# Patient Record
Sex: Male | Born: 1955 | Race: White | Hispanic: No | Marital: Single | Smoking: Current every day smoker
Health system: Southern US, Community
[De-identification: ages and names within clinical notes are randomized; demographics above are authoritative.]

## PROBLEM LIST (undated history)

## (undated) DIAGNOSIS — R51 Headache: Secondary | ICD-10-CM

## (undated) DIAGNOSIS — M1990 Unspecified osteoarthritis, unspecified site: Secondary | ICD-10-CM

## (undated) DIAGNOSIS — E785 Hyperlipidemia, unspecified: Secondary | ICD-10-CM

## (undated) DIAGNOSIS — I10 Essential (primary) hypertension: Secondary | ICD-10-CM

## (undated) DIAGNOSIS — F32A Depression, unspecified: Secondary | ICD-10-CM

## (undated) DIAGNOSIS — F329 Major depressive disorder, single episode, unspecified: Secondary | ICD-10-CM

## (undated) HISTORY — PX: OTHER SURGICAL HISTORY: SHX169

## (undated) HISTORY — DX: Depression, unspecified: F32.A

## (undated) HISTORY — DX: Major depressive disorder, single episode, unspecified: F32.9

## (undated) HISTORY — DX: Headache: R51

## (undated) HISTORY — DX: Hyperlipidemia, unspecified: E78.5

## (undated) HISTORY — DX: Unspecified osteoarthritis, unspecified site: M19.90

---

## 2003-02-20 HISTORY — PX: THUMB FUSION: SUR636

## 2011-02-06 ENCOUNTER — Encounter (HOSPITAL_COMMUNITY): Payer: Self-pay

## 2011-02-06 ENCOUNTER — Encounter (HOSPITAL_COMMUNITY): Payer: Self-pay | Admitting: Pharmacy Technician

## 2011-02-06 ENCOUNTER — Other Ambulatory Visit: Payer: Self-pay

## 2011-02-06 ENCOUNTER — Other Ambulatory Visit: Payer: Self-pay | Admitting: Otolaryngology

## 2011-02-06 ENCOUNTER — Encounter (HOSPITAL_COMMUNITY)
Admission: RE | Admit: 2011-02-06 | Discharge: 2011-02-06 | Disposition: A | Discharge status: Home / Self Care | Payer: Worker's Compensation | Source: Ambulatory Visit | Attending: Specialist | Admitting: Specialist

## 2011-02-06 DIAGNOSIS — Z01812 Encounter for preprocedural laboratory examination: Secondary | ICD-10-CM | POA: Insufficient documentation

## 2011-02-06 DIAGNOSIS — Z01818 Encounter for other preprocedural examination: Secondary | ICD-10-CM | POA: Insufficient documentation

## 2011-02-06 DIAGNOSIS — Z01810 Encounter for preprocedural cardiovascular examination: Secondary | ICD-10-CM | POA: Insufficient documentation

## 2011-02-06 HISTORY — DX: Essential (primary) hypertension: I10

## 2011-02-06 LAB — COMPREHENSIVE METABOLIC PANEL
AST: 27 U/L (ref 0–37)
Albumin: 4.3 g/dL (ref 3.5–5.2)
Calcium: 10.4 mg/dL (ref 8.4–10.5)
Chloride: 100 mEq/L (ref 96–112)
Creatinine, Ser: 0.86 mg/dL (ref 0.50–1.35)

## 2011-02-06 LAB — DIFFERENTIAL
Lymphocytes Relative: 29 % (ref 12–46)
Monocytes Absolute: 0.7 10*3/uL (ref 0.1–1.0)
Monocytes Relative: 11 % (ref 3–12)
Neutro Abs: 3.6 10*3/uL (ref 1.7–7.7)

## 2011-02-06 LAB — CBC
MCV: 88.8 fL (ref 78.0–100.0)
Platelets: 202 10*3/uL (ref 150–400)
RDW: 13.3 % (ref 11.5–15.5)
WBC: 6.4 10*3/uL (ref 4.0–10.5)

## 2011-02-06 LAB — URINALYSIS, ROUTINE W REFLEX MICROSCOPIC
Bilirubin Urine: NEGATIVE
Hgb urine dipstick: NEGATIVE
Specific Gravity, Urine: 1.013 (ref 1.005–1.030)
pH: 7 (ref 5.0–8.0)

## 2011-02-06 LAB — SURGICAL PCR SCREEN: Staphylococcus aureus: INVALID — AB

## 2011-02-06 NOTE — Patient Instructions (Addendum)
[name]  02/06/2011   Your procedure is scheduled on:  02/28/2011 1230-200pm  Report to [facility] at 1030 AM.  Call this number if you have problems the morning of surgery: [phone]   Remember:   Do not eat food:After Midnight.  May have clear liquids:until Midnight .  Clear liquids include soda, tea, black coffee, apple or grape juice, broth.  Take these medicines the morning of surgery with A SIP OF WATER:    Do not wear jewelry,   Do not wear lotions, powders, or perfumes.     Do not bring valuables to the hospital.  Contacts, dentures or bridgework may not be worn into surgery.  Leave suitcase in the car. After surgery it may be brought to your room.  For patients admitted to the hospital, checkout time is 11:00 AM the day of discharge. 02/16/11  5PM MESSAGE HAD BEEN LEFT FOR PT-REGARDING SURGERY TIME CHANGE TO 1:45 PM --AND PT NEEDING TO ARRIVE TO SHORT STAY BY 11:45 AM ON 02/27/10--NOTHING TO EAT AFTER MIDNIGHT THE NIGHT BEFORE SURGERY-MAY HAVE CLEAR LIQUIDS TO DRINK FROM [place] UNTIL 7:30 AM--NOTHING AFTER 7:30 AM.  PT CALLED BACK--RECEIVED MESSAGE.  PAT [name] RN       Special Instructions: CHG Shower Use Special Wash: 1/2 bottle night before surgery and 1/2 bottle morning of surgery. Shower chin to toes with CHG. Wash face and private parts with regular soap.    Please read over the following fact sheets that you were given: MRSA Information, Incentive Spirometry Fact Sheet, coughing and deep breathing exercises, leg exercises

## 2011-02-06 NOTE — Pre-Procedure Instructions (Signed)
02/06/11 rerequested CXR done at [facility]- office to resend.

## 2011-02-08 NOTE — Pre-Procedure Instructions (Signed)
cxr report received from [facility] -it was done 01/19/11 at [facility]--report placed on this chart.

## 2011-02-09 LAB — MRSA CULTURE

## 2011-02-20 HISTORY — PX: SHOULDER SURGERY: SHX246

## 2011-02-28 ENCOUNTER — Encounter (HOSPITAL_COMMUNITY)
Admission: RE | Disposition: A | Discharge status: Home / Self Care | Payer: Self-pay | Source: Ambulatory Visit | Attending: Specialist

## 2011-02-28 ENCOUNTER — Encounter (HOSPITAL_COMMUNITY): Payer: Self-pay | Admitting: *Deleted

## 2011-02-28 ENCOUNTER — Encounter (HOSPITAL_COMMUNITY): Payer: Self-pay | Admitting: Anesthesiology

## 2011-02-28 ENCOUNTER — Ambulatory Visit (HOSPITAL_COMMUNITY): Payer: Worker's Compensation | Admitting: Anesthesiology

## 2011-02-28 ENCOUNTER — Observation Stay (HOSPITAL_COMMUNITY)
Admission: RE | Admit: 2011-02-28 | Discharge: 2011-03-01 | Disposition: A | Discharge status: Home / Self Care | Payer: Worker's Compensation | Source: Ambulatory Visit | Attending: Specialist | Admitting: Specialist

## 2011-02-28 DIAGNOSIS — S43429A Sprain of unspecified rotator cuff capsule, initial encounter: Principal | ICD-10-CM | POA: Insufficient documentation

## 2011-02-28 DIAGNOSIS — M75100 Unspecified rotator cuff tear or rupture of unspecified shoulder, not specified as traumatic: Secondary | ICD-10-CM

## 2011-02-28 DIAGNOSIS — X58XXXA Exposure to other specified factors, initial encounter: Secondary | ICD-10-CM | POA: Insufficient documentation

## 2011-02-28 DIAGNOSIS — M7520 Bicipital tendinitis, unspecified shoulder: Secondary | ICD-10-CM | POA: Insufficient documentation

## 2011-02-28 DIAGNOSIS — I10 Essential (primary) hypertension: Secondary | ICD-10-CM | POA: Insufficient documentation

## 2011-02-28 DIAGNOSIS — F17200 Nicotine dependence, unspecified, uncomplicated: Secondary | ICD-10-CM | POA: Insufficient documentation

## 2011-02-28 DIAGNOSIS — M24119 Other articular cartilage disorders, unspecified shoulder: Secondary | ICD-10-CM | POA: Insufficient documentation

## 2011-02-28 SURGERY — SHOULDER ARTHROSCOPY WITH SUBACROMIAL DECOMPRESSION, ROTATOR CUFF REPAIR AND BICEP TENDON REPAIR
Anesthesia: General | Site: Shoulder | Laterality: Right | Wound class: Clean

## 2011-02-28 MED ORDER — PROMETHAZINE HCL 25 MG/ML IJ SOLN: 6.2500 mg | INTRAMUSCULAR | Status: DC | PRN

## 2011-02-28 MED ORDER — LIDOCAINE HCL (CARDIAC) 20 MG/ML IV SOLN
INTRAVENOUS | Status: DC | PRN
  Administered 2011-02-28: 50 mg via INTRAVENOUS

## 2011-02-28 MED ORDER — SODIUM CHLORIDE 0.9 % IV SOLN
INTRAVENOUS | Status: DC
  Administered 2011-03-01: 01:00:00 via INTRAVENOUS

## 2011-02-28 MED ORDER — HYDROMORPHONE HCL PF 1 MG/ML IJ SOLN
0.5000 mg | INTRAMUSCULAR | Status: DC | PRN
  Administered 2011-03-01: 1 mg via INTRAVENOUS
  Filled 2011-02-28 (×2): qty 1

## 2011-02-28 MED ORDER — LOSARTAN POTASSIUM 50 MG PO TABS
50.0000 mg | ORAL_TABLET | Freq: Every day | ORAL | Status: DC
  Filled 2011-02-28: qty 1

## 2011-02-28 MED ORDER — CISATRACURIUM BESYLATE 2 MG/ML IV SOLN
INTRAVENOUS | Status: DC | PRN
  Administered 2011-02-28 (×3): 2 mg via INTRAVENOUS
  Administered 2011-02-28: 10 mg via INTRAVENOUS

## 2011-02-28 MED ORDER — HYDROMORPHONE HCL PF 1 MG/ML IJ SOLN
INTRAMUSCULAR | Status: AC
  Administered 2011-02-28: 1 mg
  Filled 2011-02-28: qty 1

## 2011-02-28 MED ORDER — BUPIVACAINE-EPINEPHRINE 0.5% -1:200000 IJ SOLN
INTRAMUSCULAR | Status: DC | PRN
  Administered 2011-02-28: 25 mL

## 2011-02-28 MED ORDER — OXYCODONE-ACETAMINOPHEN 5-325 MG PO TABS
1.0000 | ORAL_TABLET | ORAL | Status: DC | PRN
  Administered 2011-02-28 – 2011-03-01 (×3): 2 via ORAL
  Filled 2011-02-28 (×3): qty 2

## 2011-02-28 MED ORDER — EPHEDRINE SULFATE 50 MG/ML IJ SOLN
INTRAMUSCULAR | Status: DC | PRN
  Administered 2011-02-28: 10 mg via INTRAVENOUS

## 2011-02-28 MED ORDER — SODIUM CHLORIDE 0.9 % IR SOLN
Status: DC | PRN
  Administered 2011-02-28: 15:00:00

## 2011-02-28 MED ORDER — AMLODIPINE BESYLATE 5 MG PO TABS
5.0000 mg | ORAL_TABLET | Freq: Every day | ORAL | Status: DC
  Filled 2011-02-28: qty 1

## 2011-02-28 MED ORDER — METOCLOPRAMIDE HCL 10 MG PO TABS: 5.0000 mg | ORAL_TABLET | Freq: Three times a day (TID) | ORAL | Status: DC | PRN

## 2011-02-28 MED ORDER — METHYLPHENIDATE HCL ER (LA) 10 MG PO CP24
20.0000 mg | ORAL_CAPSULE | ORAL | Status: DC
  Administered 2011-03-01: 20 mg via ORAL
  Filled 2011-02-28: qty 2

## 2011-02-28 MED ORDER — ACETAMINOPHEN 10 MG/ML IV SOLN
INTRAVENOUS | Status: DC | PRN
  Administered 2011-02-28: 1000 mg via INTRAVENOUS

## 2011-02-28 MED ORDER — PHENYLEPHRINE HCL 10 MG/ML IJ SOLN
INTRAMUSCULAR | Status: DC | PRN
  Administered 2011-02-28 (×2): 80 ug via INTRAVENOUS

## 2011-02-28 MED ORDER — ONDANSETRON HCL 4 MG PO TABS: 4.0000 mg | ORAL_TABLET | Freq: Four times a day (QID) | ORAL | Status: DC | PRN

## 2011-02-28 MED ORDER — LACTATED RINGERS IV SOLN
INTRAVENOUS | Status: DC | PRN
  Administered 2011-02-28: 14:00:00

## 2011-02-28 MED ORDER — HYDROMORPHONE HCL PF 1 MG/ML IJ SOLN: 0.2500 mg | INTRAMUSCULAR | Status: DC | PRN

## 2011-02-28 MED ORDER — HYDROCHLOROTHIAZIDE 25 MG PO TABS
25.0000 mg | ORAL_TABLET | Freq: Every day | ORAL | Status: DC
  Filled 2011-02-28: qty 1

## 2011-02-28 MED ORDER — METOCLOPRAMIDE HCL 5 MG/ML IJ SOLN: 5.0000 mg | Freq: Three times a day (TID) | INTRAMUSCULAR | Status: DC | PRN

## 2011-02-28 MED ORDER — GLYCOPYRROLATE 0.2 MG/ML IJ SOLN
INTRAMUSCULAR | Status: DC | PRN
  Administered 2011-02-28: .6 mg via INTRAVENOUS

## 2011-02-28 MED ORDER — PHENYLEPHRINE HCL 10 MG/ML IJ SOLN
10.0000 mg | INTRAVENOUS | Status: DC | PRN
  Administered 2011-02-28: 50 ug/min via INTRAVENOUS

## 2011-02-28 MED ORDER — OLMESARTAN MEDOXOMIL 40 MG PO TABS
40.0000 mg | ORAL_TABLET | Freq: Every day | ORAL | Status: DC
  Filled 2011-02-28: qty 1

## 2011-02-28 MED ORDER — MENTHOL 3 MG MT LOZG: 1.0000 | LOZENGE | OROMUCOSAL | Status: DC | PRN

## 2011-02-28 MED ORDER — FENTANYL CITRATE 0.05 MG/ML IJ SOLN
INTRAMUSCULAR | Status: DC | PRN
  Administered 2011-02-28 (×2): 25 ug via INTRAVENOUS
  Administered 2011-02-28 (×2): 100 ug via INTRAVENOUS

## 2011-02-28 MED ORDER — HETASTARCH-ELECTROLYTES 6 % IV SOLN
INTRAVENOUS | Status: DC | PRN
  Administered 2011-02-28: 14:00:00 via INTRAVENOUS

## 2011-02-28 MED ORDER — DIPHENHYDRAMINE HCL 12.5 MG/5ML PO ELIX: 12.5000 mg | ORAL_SOLUTION | ORAL | Status: DC | PRN

## 2011-02-28 MED ORDER — HYDROCODONE-ACETAMINOPHEN 7.5-325 MG PO TABS: 1.0000 | ORAL_TABLET | ORAL | Status: DC | PRN

## 2011-02-28 MED ORDER — LACTATED RINGERS IR SOLN
Status: DC | PRN
  Administered 2011-02-28: 2

## 2011-02-28 MED ORDER — BUSPIRONE HCL 15 MG PO TABS
15.0000 mg | ORAL_TABLET | Freq: Every day | ORAL | Status: DC
  Filled 2011-02-28: qty 1

## 2011-02-28 MED ORDER — OLMESARTAN-AMLODIPINE-HCTZ 40-5-25 MG PO TABS: 1.0000 | ORAL_TABLET | Freq: Every day | ORAL | Status: DC

## 2011-02-28 MED ORDER — ONDANSETRON HCL 4 MG/2ML IJ SOLN
INTRAMUSCULAR | Status: DC | PRN
  Administered 2011-02-28: 4 mg via INTRAVENOUS

## 2011-02-28 MED ORDER — NEOSTIGMINE METHYLSULFATE 1 MG/ML IJ SOLN
INTRAMUSCULAR | Status: DC | PRN
  Administered 2011-02-28: 4 mg via INTRAVENOUS

## 2011-02-28 MED ORDER — ACETAMINOPHEN 325 MG PO TABS
650.0000 mg | ORAL_TABLET | Freq: Four times a day (QID) | ORAL | Status: DC | PRN
  Administered 2011-03-01: 650 mg via ORAL
  Filled 2011-02-28: qty 2

## 2011-02-28 MED ORDER — METHOCARBAMOL 100 MG/ML IJ SOLN
500.0000 mg | Freq: Four times a day (QID) | INTRAVENOUS | Status: DC | PRN
  Filled 2011-02-28 (×2): qty 5

## 2011-02-28 MED ORDER — LACTATED RINGERS IV SOLN
INTRAVENOUS | Status: DC
  Administered 2011-02-28 (×2): via INTRAVENOUS

## 2011-02-28 MED ORDER — MIDAZOLAM HCL 5 MG/5ML IJ SOLN
INTRAMUSCULAR | Status: DC | PRN
  Administered 2011-02-28: 2 mg via INTRAVENOUS

## 2011-02-28 MED ORDER — PHENOL 1.4 % MT LIQD: 1.0000 | OROMUCOSAL | Status: DC | PRN

## 2011-02-28 MED ORDER — CEFAZOLIN SODIUM-DEXTROSE 2-3 GM-% IV SOLR
2.0000 g | INTRAVENOUS | Status: AC
  Administered 2011-02-28: 2 g via INTRAVENOUS

## 2011-02-28 MED ORDER — KETOROLAC TROMETHAMINE 30 MG/ML IJ SOLN
30.0000 mg | Freq: Four times a day (QID) | INTRAMUSCULAR | Status: DC
  Administered 2011-02-28 – 2011-03-01 (×3): 30 mg via INTRAVENOUS
  Filled 2011-02-28 (×6): qty 1

## 2011-02-28 MED ORDER — MEPERIDINE HCL 25 MG/ML IJ SOLN: 6.2500 mg | INTRAMUSCULAR | Status: DC | PRN

## 2011-02-28 MED ORDER — ONDANSETRON HCL 4 MG/2ML IJ SOLN: 4.0000 mg | Freq: Four times a day (QID) | INTRAMUSCULAR | Status: DC | PRN

## 2011-02-28 MED ORDER — ACETAMINOPHEN 650 MG RE SUPP: 650.0000 mg | Freq: Four times a day (QID) | RECTAL | Status: DC | PRN

## 2011-02-28 MED ORDER — CHLORHEXIDINE GLUCONATE 4 % EX LIQD: 60.0000 mL | Freq: Once | CUTANEOUS | Status: DC

## 2011-02-28 MED ORDER — CEFAZOLIN SODIUM-DEXTROSE 2-3 GM-% IV SOLR
2.0000 g | Freq: Four times a day (QID) | INTRAVENOUS | Status: DC
  Administered 2011-03-01 (×2): 2 g via INTRAVENOUS
  Filled 2011-02-28 (×4): qty 50

## 2011-02-28 MED ORDER — HYDROMORPHONE HCL PF 1 MG/ML IJ SOLN
INTRAMUSCULAR | Status: DC | PRN
  Administered 2011-02-28 (×2): 1 mg via INTRAVENOUS

## 2011-02-28 MED ORDER — LACTATED RINGERS IV SOLN: INTRAVENOUS | Status: DC

## 2011-02-28 MED ORDER — METHOCARBAMOL 500 MG PO TABS
500.0000 mg | ORAL_TABLET | Freq: Four times a day (QID) | ORAL | Status: DC | PRN
  Administered 2011-02-28 – 2011-03-01 (×3): 500 mg via ORAL
  Filled 2011-02-28 (×3): qty 1

## 2011-02-28 MED ORDER — PROPOFOL 10 MG/ML IV EMUL
INTRAVENOUS | Status: DC | PRN
  Administered 2011-02-28: 50 mg via INTRAVENOUS
  Administered 2011-02-28: 200 mg via INTRAVENOUS

## 2011-02-28 SURGICAL SUPPLY — 57 items
ANCH SUT 2 CP-2 ROTR CUF (Anchor) ×2 IMPLANT
ANCH SUT PUSHLCK 24X4.5 STRL (Orthopedic Implant) ×1 IMPLANT
ANCHOR ROTATOR CUFF #2 (Anchor) ×4 IMPLANT
APL SKNCLS STERI-STRIP NONHPOA (GAUZE/BANDAGES/DRESSINGS)
BAG SPEC THK2 15X12 ZIP CLS (MISCELLANEOUS) ×1
BAG ZIPLOCK 12X15 (MISCELLANEOUS) ×2 IMPLANT
BENZOIN TINCTURE PRP APPL 2/3 (GAUZE/BANDAGES/DRESSINGS) IMPLANT
BLADE CUDA 4.2 (BLADE) ×1 IMPLANT
BLADE OSCILLATING/SAGITTAL (BLADE)
BLADE SW THK.38XMED LNG THN (BLADE) ×1 IMPLANT
BNDG ADH 5X4 AIR PERM ELC (GAUZE/BANDAGES/DRESSINGS)
BNDG COHESIVE 4X5 WHT NS (GAUZE/BANDAGES/DRESSINGS) IMPLANT
BUR OVAL CARBIDE 4.0 (BURR) ×1 IMPLANT
CANNULA ACUFO 5X76 (CANNULA) ×1 IMPLANT
CHLORAPREP W/TINT 26ML (MISCELLANEOUS) IMPLANT
CLEANER TIP ELECTROSURG 2X2 (MISCELLANEOUS) ×2 IMPLANT
CLOTH BEACON ORANGE TIMEOUT ST (SAFETY) ×2 IMPLANT
DECANTER SPIKE VIAL GLASS SM (MISCELLANEOUS) ×2 IMPLANT
DRAPE ORTHO SPLIT 77X108 STRL (DRAPES) ×2
DRAPE POUCH INSTRU U-SHP 10X18 (DRAPES) ×2 IMPLANT
DRAPE SURG ORHT 6 SPLT 77X108 (DRAPES) ×1 IMPLANT
DRSG ADAPTIC 3X8 NADH LF (GAUZE/BANDAGES/DRESSINGS) ×2 IMPLANT
DRSG PAD ABDOMINAL 8X10 ST (GAUZE/BANDAGES/DRESSINGS) ×1 IMPLANT
DURAPREP 26ML APPLICATOR (WOUND CARE) ×2 IMPLANT
ELECT NEEDLE TIP 2.8 STRL (NEEDLE) ×2 IMPLANT
ELECT REM PT RETURN 9FT ADLT (ELECTROSURGICAL) ×2
ELECTRODE REM PT RTRN 9FT ADLT (ELECTROSURGICAL) ×1 IMPLANT
GAUZE SPONGE 4X4 12PLY STRL LF (GAUZE/BANDAGES/DRESSINGS) ×1 IMPLANT
GLOVE BIOGEL PI IND STRL 6.5 (GLOVE) ×1 IMPLANT
GLOVE BIOGEL PI IND STRL 8 (GLOVE) ×1 IMPLANT
GLOVE BIOGEL PI INDICATOR 6.5 (GLOVE) ×1
GLOVE BIOGEL PI INDICATOR 8 (GLOVE)
GLOVE ECLIPSE 6.5 STRL STRAW (GLOVE) ×2 IMPLANT
GLOVE SURG SS PI 8.0 STRL IVOR (GLOVE) ×4 IMPLANT
KIT BASIN OR (CUSTOM PROCEDURE TRAY) ×2 IMPLANT
MANIFOLD NEPTUNE II (INSTRUMENTS) ×2 IMPLANT
NDL MA TROC 1/2 (NEEDLE) ×1 IMPLANT
NDL SPNL 18GX3.5 QUINCKE PK (NEEDLE) IMPLANT
NEEDLE MA TROC 1/2 (NEEDLE) IMPLANT
NEEDLE MA TROC 1/2 CIR (NEEDLE) IMPLANT
NEEDLE SPNL 18GX3.5 QUINCKE PK (NEEDLE) ×2 IMPLANT
PACK SHOULDER CUSTOM OPM052 (CUSTOM PROCEDURE TRAY) ×2 IMPLANT
POSITIONER SURGICAL ARM (MISCELLANEOUS) ×2 IMPLANT
PUSHLOCK PEEK 4.5X24 (Orthopedic Implant) ×2 IMPLANT
SET ARTHROSCOPY TUBING (MISCELLANEOUS) ×2
SET ARTHROSCOPY TUBING LN (MISCELLANEOUS) IMPLANT
SLING ULTRA II L (ORTHOPEDIC SUPPLIES) ×2 IMPLANT
SPONGE LAP 4X18 X RAY DECT (DISPOSABLE) IMPLANT
STRIP CLOSURE SKIN 1/2X4 (GAUZE/BANDAGES/DRESSINGS) ×1 IMPLANT
SUT BONE WAX W31G (SUTURE) ×1 IMPLANT
SUT ETHIBOND 0 (SUTURE) ×2 IMPLANT
SUT ETHIBOND 2 OS 4 DA (SUTURE) ×2 IMPLANT
SUT ETHILON 4 0 PS 2 18 (SUTURE) ×1 IMPLANT
SUT PROLENE 3 0 PS 2 (SUTURE) ×1 IMPLANT
SUT VIC AB 1-0 CT2 27 (SUTURE) ×3 IMPLANT
SUT VIC AB 2-0 CT2 27 (SUTURE) ×1 IMPLANT
SUT VICRYL 0 UR6 27IN ABS (SUTURE) ×2 IMPLANT

## 2011-02-28 NOTE — Anesthesia Postprocedure Evaluation (Signed)
  Anesthesia Post-op Note  Patient: John Best  Procedure(s) Performed:  SHOULDER ARTHROSCOPY WITH SUBACROMIAL DECOMPRESSION, ROTATOR CUFF REPAIR AND BICEP TENDON REPAIR - Right Shoulder Arthroscopy, Subacromial Decompression, Mini Open Rotator Cuff Repair, biceps tenotomy  Patient Location: PACU  Anesthesia Type: General  Level of Consciousness: awake and alert   Airway and Oxygen Therapy: Patient Spontanous Breathing  Post-op Pain: mild  Post-op Assessment: Post-op Vital signs reviewed, Patient's Cardiovascular Status Stable, Respiratory Function Stable, Patent Airway and No signs of Nausea or vomiting  Post-op Vital Signs: stable  Complications: No apparent anesthesia complications

## 2011-02-28 NOTE — Anesthesia Preprocedure Evaluation (Signed)
Anesthesia Evaluation  Patient identified by MRN, date of birth, ID band Patient awake    Reviewed: Allergy & Precautions, H&P , NPO status , Patient's Chart, lab work & pertinent test results  Airway Mallampati: II TM Distance: >3 FB Neck ROM: full    Dental No notable dental hx.    Pulmonary neg pulmonary ROS,  clear to auscultation  Pulmonary exam normal       Cardiovascular Exercise Tolerance: Good hypertension, Pt. on medications neg cardio ROS regular Normal    Neuro/Psych Negative Neurological ROS  Negative Psych ROS   GI/Hepatic negative GI ROS, Neg liver ROS,   Endo/Other  Negative Endocrine ROS  Renal/GU negative Renal ROS  Genitourinary negative   Musculoskeletal   Abdominal   Peds  Hematology negative hematology ROS (+)   Anesthesia Other Findings   Reproductive/Obstetrics negative OB ROS                           Anesthesia Physical Anesthesia Plan  ASA: II  Anesthesia Plan: General and General ETT   Post-op Pain Management: MAC Combined w/ Regional for Post-op pain   Induction:   Airway Management Planned:   Additional Equipment:   Intra-op Plan:   Post-operative Plan:   Informed Consent: I have reviewed the patients History and Physical, chart, labs and discussed the procedure including the risks, benefits and alternatives for the proposed anesthesia with the patient or authorized representative who has indicated his/her understanding and acceptance.   Dental Advisory Given  Plan Discussed with: CRNA  Anesthesia Plan Comments:         Anesthesia Quick Evaluation

## 2011-02-28 NOTE — H&P (Signed)
John Best is an 56 y.o. male.   Chief Complaint: right RCT HPI: Right rotator cuff tear   Past Medical History  Diagnosis Date  . Hypertension     Past Surgical History  Procedure Date  . Other surgical history     bilateral thumb fusions     History reviewed. No pertinent family history. Social History:  reports that he has been smoking Cigarettes.  He has a 30 pack-year smoking history. He has never used smokeless tobacco. He reports that he drinks alcohol. He reports that he does not use illicit drugs.  Allergies: No Known Allergies  Medications Prior to Admission  Medication Dose Route Frequency Provider Last Rate Last Dose  . ceFAZolin (ANCEF) IVPB 2 g/50 mL premix  2 g Intravenous 60 min Pre-Op Liam Graham, PA      . chlorhexidine (HIBICLENS) 4 % liquid 4 application  60 mL Topical Once Liam Graham, PA      . lactated ringers infusion   Intravenous Continuous Liam Graham, PA       Medications Prior to Admission  Medication Sig Dispense Refill  . Multiple Vitamin (MULTIVITAMIN) tablet Take 1 tablet by mouth daily.        No results found for this or any previous visit (from the past 48 hour(s)). No results found.  Review of Systems  Constitutional: Negative.   HENT: Negative.   Eyes: Negative.   Respiratory: Negative.   Cardiovascular: Negative.   Gastrointestinal: Negative.   Genitourinary: Negative.   Musculoskeletal: Positive for joint pain.  Skin: Negative.   Neurological: Negative.   Endo/Heme/Allergies: Negative.   Psychiatric/Behavioral: Negative.     Blood pressure 139/94, pulse 89, temperature 98.4 F (36.9 C), temperature source Oral, resp. rate 18, SpO2 96.00%. Physical Exam  Constitutional: He appears well-developed.  HENT:  Head: Normocephalic.  Eyes: Pupils are equal, round, and reactive to light.  Neck: Normal range of motion.  Cardiovascular: Normal rate.   Respiratory: Effort normal.  GI: Soft.    Musculoskeletal: He exhibits tenderness.       Weak ext rotation right shoulder. Positive impingement right     Assessment/Plan  Right rotator cuff tear. Plan RCR possible SA SAD debr labrum. Risks discussed.  [name] C 02/28/2011, 12:54 PM

## 2011-02-28 NOTE — Transfer of Care (Signed)
Immediate Anesthesia Transfer of Care Note  Patient: John Best  Procedure(s) Performed:  SHOULDER ARTHROSCOPY WITH SUBACROMIAL DECOMPRESSION, ROTATOR CUFF REPAIR AND BICEP TENDON REPAIR - Right Shoulder Arthroscopy, Subacromial Decompression, Mini Open Rotator Cuff Repair, biceps tenotomy  Patient Location: PACU  Anesthesia Type: General  Level of Consciousness: awake, alert , oriented, patient cooperative and responds to stimulation  Airway & Oxygen Therapy: Patient Spontanous Breathing and Patient connected to face mask oxygen  Post-op Assessment: Report given to PACU RN, Post -op Vital signs reviewed and stable and Patient moving all extremities  Post vital signs: Reviewed and stable  Complications: No apparent anesthesia complications

## 2011-02-28 NOTE — Brief Op Note (Signed)
02/28/2011  3:37 PM  PATIENT:  John Best  56 y.o. male  PRE-OPERATIVE DIAGNOSIS:  rotator cuff tear on right, impingement, biceps tendonitits  POST-OPERATIVE DIAGNOSIS:  rotator cuff tear on right, impingement, biceps tendonitis  PROCEDURE:  Procedure(s): SHOULDER ARTHROSCOPY WITH SUBACROMIAL DECOMPRESSION, ROTATOR CUFF REPAIR AND BICEP TENDON REPAIR  SURGEON:  Surgeon(s): Javier Docker  PHYSICIAN ASSISTANT:   ASSISTANTS: Strader   ANESTHESIA:   general  EBL:  Total I/O In: 1500 [I.V.:1000; IV Piggyback:500] Out: -   BLOOD ADMINISTERED:none  DRAINS: none   LOCAL MEDICATIONS USED:  MARCAINE 20CC  SPECIMEN:  No Specimen  DISPOSITION OF SPECIMEN:  N/A  COUNTS:  YES  TOURNIQUET:  * No tourniquets in log *  DICTATION:  Dictation number [id number]  PLAN OF CARE: Admit for overnight observation  PATIENT DISPOSITION:  PACU - hemodynamically stable.   Delay start of Pharmacological VTE agent (>24hrs) due to surgical blood loss or risk of bleeding:  {YES/NO/NOT APPLICABLE:20182

## 2011-03-01 MED ORDER — METHOCARBAMOL 500 MG PO TABS: 500.0000 mg | ORAL_TABLET | Freq: Four times a day (QID) | ORAL | Status: AC | PRN

## 2011-03-01 MED ORDER — HYDROCODONE-ACETAMINOPHEN 7.5-325 MG PO TABS: 1.0000 | ORAL_TABLET | ORAL | Status: AC | PRN

## 2011-03-01 NOTE — Progress Notes (Signed)
Pt has been d/c to the home. Pt and pt's family verbalize understanding of all d/c instructions and state there are no further questions. Pt taken to car via wheelchair.Cyndie Woodbeck Lorraine Lawarence Meek, RN    

## 2011-03-01 NOTE — Progress Notes (Signed)
Subjective: 1 Day Post-Op Procedure(s) (LRB): SHOULDER ARTHROSCOPY WITH SUBACROMIAL DECOMPRESSION, ROTATOR CUFF REPAIR AND BICEP TENDON REPAIR (Right) Patient reports pain as 5 on 0-10 scale.   Denies CP or SOB.  Voiding without difficulty. Positive flatus. Objective: Vital signs in last 24 hours: Temp:  [97.3 F (36.3 C)-99.4 F (37.4 C)] 98.6 F (37 C) (01/10 0515) Pulse Rate:  [66-89] 73  (01/10 0515) Resp:  [11-18] 18  (01/10 0515) BP: (106-139)/(62-94) 107/70 mmHg (01/10 0932) SpO2:  [92 %-100 %] 97 % (01/10 0515)  Intake/Output from previous day: 01/09 0701 - 01/10 0700 In: 4862 [P.O.:1462; I.V.:2800; IV Piggyback:600] Out: 825 [Urine:775; Blood:50] Intake/Output this shift: Total I/O In: -  Out: 450 [Urine:450]  No results found for this basename: HGB:5 in the last 72 hours No results found for this basename: WBC:2,RBC:2,HCT:2,PLT:2 in the last 72 hours No results found for this basename: NA:2,K:2,CL:2,CO2:2,BUN:2,CREATININE:2,GLUCOSE:2,CALCIUM:2 in the last 72 hours No results found for this basename: LABPT:2,INR:2 in the last 72 hours  Neurologically intact Neurovascular intact Sensation intact distally Compartment soft sling intact  Assessment/Plan: 1 Day Post-Op Procedure(s) (LRB): SHOULDER ARTHROSCOPY WITH SUBACROMIAL DECOMPRESSION, ROTATOR CUFF REPAIR AND BICEP TENDON REPAIR (Right) D/c home after OT  Eylin Pontarelli R. 03/01/2011, 9:51 AM

## 2011-03-01 NOTE — Progress Notes (Signed)
Pt has been educated regarding correct use of Incentive Spirometer (IS). Pt verbalizes understanding of correct use. Pt states that there is no further questions regarding correct use of the IS. Ritamarie Arkin Lorraine Oluwaseyi Tull, RN    

## 2011-03-01 NOTE — Op Note (Signed)
NAMEGAUDENCIO, CHESNUT NO.:  [id number]  MEDICAL RECORD NO.:  [id number]  LOCATION:  1528                         FACILITY:  [facility]  PHYSICIAN:  Jene Every, M.D.    DATE OF BIRTH:  [birth year]  DATE OF PROCEDURE: DATE OF DISCHARGE:                              OPERATIVE REPORT   PREOPERATIVE DIAGNOSES:  Rotator cuff tear of the right shoulder, biceps tendinopathy, and labral tear.  POSTOPERATIVE DIAGNOSES:  Rotator cuff tear of the right shoulder, biceps tendinopathy, and labral tear.  PROCEDURE PERFORMED: 1. Right shoulder arthroscopy with tenotomy of the biceps tendon and     debridement of extensive tearing of the superior, posterior, and     anterior labrum. 2. Mini open rotator cuff repair utilizing Mitek push lock suture     anchors.  ANESTHESIA:  General.  ASSISTANT:  Roma Schanz, PA.  BRIEF HISTORY:  This is a pleasant gentleman, 76, injured himself at work, has a full-thickness tear of the posterior supraspinatus and infraspinatus, persistent pain and anterior pain as well, biceps tendinopathy with subluxing the biceps tendon as noted on the MRI, and he had labral tearing.  He is indicated for shoulder arthroscopy with debridement of labrum, possible biceps tenotomy versus tenodesis, followed by mini open rotator cuff repair and subacromial decompression. Risk and benefits discussed including bleeding, infection, damage to neurovascular structures, no change in symptoms, worsening symptoms, need for repeat debridement, DVT, PE, anesthetic complications, etc.  TECHNIQUE:  With the patient in supine beach chair position, after induction of adequate general anesthesia, 2 g Kefzol, the right shoulder and upper extremity was prepped and draped in usual sterile fashion. Surgical marker utilized to delineate the acromion, AC joint, and coracoid.  Standard posterolateral and anterolateral portals were utilized with an incision through the skin  only.  With the arm in 70:30 position, we advanced a cannula in the glenohumeral joint penetrating atraumatically.  Noted was full-thickness tear of the rotator cuff at the posterior portion of the supraspinatus.  There was extensive tearing of the labrum.  The biceps was extending into the bicipital groove, was torn along the significant portion, attenuated and frayed.  There was approximately 20% remaining, that was unrepairable as well as the biceps extended into extensive labral tearing.  There was detachment of the labrum and extensive tearing of the labrum.  __________ localizes with an anterior portal with an 18-gauge needle beneath the biceps tendon with a #11 blade and introduced the cannula and introduced a shaver, debrided the labrum to a stable base.  Multiple areas of attachment were noted of the labrum.  A basket was introduced and performed a tenotomy of the biceps tendon, and then the shaver was introduced and debrided the stump to the stable base.  The inferior labrum was affected. Minimal degenerative changes of the glenohumeral joint was noted. Minimal tearing of the subscap was noted as well.  Again, I felt leaving the biceps tendon with a pain generator using traction on the torn labrum.  On location of the rotator cuff, tear was more on the posterior aspect of the supraspinatus and the infraspinatus, and I did not feel a separate approach for a tenodesis of the  biceps was appropriate.  I redirected the camera in subacromial space, made a small incision in the lateral skin for an anterolateral portal, triangulated, introduced the cannula, used at 65 mmHg.  I released the CA ligament, performed a full bursectomy and the tear was confirmed.  There was some fair amount of fraying over the tear and the side-to-side tight tear was split after the subacromial decompression with a mini open incision.  Small 2 cm incision was made between the anterolateral heads of the deltoid  off the anterolateral aspect of the acromion.  After 0.25% Marcaine with epinephrine was infiltrated there, I then preserved the deltoid attachment, placed a self-retaining retractor, digitally lysing the adhesions and the subacromial joint, identified the rotator cuff tear. Again, I was at the junction of the infraspinatus, posterior aspect of the supraspinatus longitudinal, significant fraying and hyperemia noted there.  I debrided the edges to good bleeding tissue.  I prepared a bed with good bleeding bone with a Matt Holmes rongeur and mobilize the tissue side- to-side, placed 2 Mitek suture anchors there along the length of the tear and threaded on either side of the supraspinatus and infraspinatus, mobilized and side-to-side repair, good surgical knot and then a freehand Ethibond interrupted figure-of-eight suture side-to-side.  The leaflets were then crossed and placed over the lateral aspect of the greater tuberosity and I inserted in the push lock for double row protection using awl, inserted push lock, tapped it in that as well as the Mitek suture anchors, had good resistance to pullout.  Full coverage was obtained.  Again, although there was an extensive fraying there, had an appropriate opportunity to heal.  Watertight coverage was noted.  We used a 3 mm Kerrison around the small spur on the anterolateral aspect of the acromion as well.  Copiously irrigated the subacromial space. The remainder was unremarkable.  I repaired the raphe with #1 Vicryl interrupted figure-of-eight sutures, subcu with 2-0 Vicryl simple sutures.  Skin was reapproximated 4-0 subcuticular Prolene.  Wound re- enforced with Steri-Strips.  Sterile dressing applied.  Placed supine on the hospital bed, extubated without difficulty after placing an abduction pillow and the sling.  The patient tolerated the procedure well.  No complications.  Assistant, Roma Schanz.  Minimal blood loss.     Jene Every,  M.D.     Cordelia Pen  D:  02/28/2011  T:  03/01/2011  Job:  [id number]

## 2011-03-01 NOTE — Progress Notes (Signed)
OT Eval   Written eval completed and will be filed in the shadow chart.  Ral[place], OTR/L [phone] 03/01/2011

## 2011-03-01 NOTE — Progress Notes (Signed)
Occupational Therapy Treatment Patient Details Name: John Best MRN: [id number] DOB: May 02, 1955 Today's Date: 03/01/2011 1105 [address]2 [place]  OT Assessment/Plan OT Assessment/Plan OT Plan: Discharge plan remains appropriate Follow Up Recommendations:  (pt will follow up with Dr Shelle Iron for OP when appropriate)  No further acute needs OT Goals Acute Rehab OT Goals OT Goal Formulation: With patient Time For Goal Achievement: 7 days Miscellaneous OT Goals Miscellaneous OT Goal #1: daughter will be independent with donning sling OT Goal: Miscellaneous Goal #1 - Progress: Met Miscellaneous OT Goal #2: daughter will verbalize understanding of precautions for pt's RUE secondary to RCR, without cues OT Goal: Miscellaneous Goal #2 - Progress: Met  OT Treatment Precautions/Restrictions  Precautions Precautions: Shoulder Type of Shoulder Precautions: sling off for bathing/dressing; no ROM Precaution Booklet Issued: Yes (comment) Restrictions Weight Bearing Restrictions: No   ADL ADL ADL Comments: daugher presented:  educated on ADL precautions and pt observed UB dressing, and dressing change.  She was able to don sling with set up.  She verbalizes understanding of all education Mobility  Bed Mobility Bed Mobility: Yes Supine to Sit: 6: Modified independent (Device/Increase time);HOB elevated (Comment degrees) (60) Transfers Transfers: Yes Sit to Stand: 7: Independent Exercises    End of Session OT - End of Session Activity Tolerance: Patient tolerated treatment well Patient left: in bed;with call bell in reach;Other (comment) (eob) Nurse Communication:  (Licensed conveyancer for page when daughter is here) General Behavior During Session: [facility] for tasks performed Cognition: [facility] for tasks performed  Annett Boxwell [phone] 03/01/2011, 11:31 AM

## 2011-03-02 NOTE — Discharge Summary (Signed)
Physician Discharge Summary  Patient ID: John Best MRN: [id number] DOB/AGE: 1955/10/06 56 y.o.  Admit date: 02/28/2011 Discharge date: 03/02/2011  Admission Diagnoses: Hypertension  Rotator Cuff Tear  Discharge Diagnoses:  Hypertension  S/p RCR  Discharged Condition: good  Hospital Course:  uneventfull  Procedure Note: Right shoulder arthroscopy with tenotomy of the biceps tendon and  debridement of extensive tearing of the superior, posterior, and  anterior labrum.  2. Mini open rotator cuff repair utilizing Mitek push lock suture  anchors.    Disposition: Home or Self Care  Discharge Orders    Future Orders Please Complete By Expires   Diet - low sodium heart healthy      Call MD / Call 911      Comments:   If you experience chest pain or shortness of breath, CALL 911 and be transported to the hospital emergency room.  If you develope a fever above 101 F, pus (white drainage) or increased drainage or redness at the wound, or calf pain, call your surgeon's office.   Constipation Prevention      Comments:   Drink plenty of fluids.  Prune juice may be helpful.  You may use a stool softener, such as Colace (over the counter) 100 mg twice a day.  Use MiraLax (over the counter) for constipation as needed.   Increase activity slowly as tolerated      Weight Bearing as taught in Physical Therapy      Comments:   Use a walker or crutches as instructed.   Discharge instructions      Comments:    Ice pack to shoulder 3-4 times a day May get out of sling in AM and start gentle pendulum exercises.  You are encouraged to move the elbow,hand, and wrist.   Elevate shoulder and elbow on pillow several times a day. Use sling at all times unless arm is supported or exercising Avoid overhead activity No lifting No driving for 4-6 weeks Change dressing daily       Discharge Medication List as of 03/01/2011 11:31 AM    START taking these medications   Details    HYDROcodone-acetaminophen (NORCO) 7.5-325 MG per tablet Take 1-2 tablets by mouth every 4 (four) hours as needed., Starting 03/01/2011, Until Sun 03/11/11, Print    methocarbamol (ROBAXIN) 500 MG tablet Take 1 tablet (500 mg total) by mouth every 6 (six) hours as needed., Starting 03/01/2011, Until Sun 03/11/11, Print      CONTINUE these medications which have NOT CHANGED   Details  Multiple Vitamin (MULTIVITAMIN) tablet Take 1 tablet by mouth daily., Until Discontinued, Historical Med    busPIRone (BUSPAR) 15 MG tablet Take 15 mg by mouth daily with breakfast.  , Until Discontinued, Historical Med    losartan (COZAAR) 50 MG tablet Take 50 mg by mouth every morning. , Until Discontinued, Historical Med    methylphenidate (RITALIN LA) 20 MG 24 hr capsule Take 20 mg by mouth every morning. , Until Discontinued, Historical Med    naproxen (NAPROSYN) 500 MG tablet Take 500 mg by mouth every morning. , Until Discontinued, Historical Med    Olmesartan-Amlodipine-HCTZ (TRIBENZOR) 40-5-25 MG TABS Take 1 tablet by mouth daily with breakfast.  , Until Discontinued, Historical Med      STOP taking these medications     HYDROcodone-acetaminophen (NORCO) 5-325 MG per tablet        Follow-up Information    Follow up with BEANE,JEFFREY C in 14 days.   Contact information:  [facility] [address], [address] [place] [address] [phone]          Signed: Liam Graham. 03/02/2011, 8:28 AM

## 2011-03-23 MED FILL — Ropivacaine HCl Inj 5 MG/ML: INTRAMUSCULAR | Qty: 30 | Status: AC

## 2011-05-16 ENCOUNTER — Encounter: Payer: Self-pay | Admitting: Family Medicine

## 2011-05-16 ENCOUNTER — Ambulatory Visit: Payer: Self-pay | Admitting: Family Medicine

## 2011-05-16 ENCOUNTER — Ambulatory Visit (INDEPENDENT_AMBULATORY_CARE_PROVIDER_SITE_OTHER): Payer: Worker's Compensation | Admitting: Family Medicine

## 2011-05-16 VITALS — BP 120/78 | HR 100 | Temp 98.2°F | Resp 12 | Ht 67.0 in | Wt 211.0 lb

## 2011-05-16 DIAGNOSIS — F988 Other specified behavioral and emotional disorders with onset usually occurring in childhood and adolescence: Secondary | ICD-10-CM | POA: Insufficient documentation

## 2011-05-16 DIAGNOSIS — I10 Essential (primary) hypertension: Secondary | ICD-10-CM

## 2011-05-16 DIAGNOSIS — G43909 Migraine, unspecified, not intractable, without status migrainosus: Secondary | ICD-10-CM | POA: Insufficient documentation

## 2011-05-16 DIAGNOSIS — E785 Hyperlipidemia, unspecified: Secondary | ICD-10-CM

## 2011-05-16 MED ORDER — METHYLPHENIDATE HCL ER (LA) 20 MG PO CP24: 20.0000 mg | ORAL_CAPSULE | ORAL | Status: DC

## 2011-05-16 MED ORDER — OLMESARTAN-AMLODIPINE-HCTZ 40-5-25 MG PO TABS: 1.0000 | ORAL_TABLET | Freq: Every day | ORAL | Status: DC

## 2011-05-16 NOTE — Progress Notes (Signed)
  Subjective:    Patient ID: John Best, male    DOB: 08/31/1955, 56 y.o.   MRN: [id number]  HPI  Patient seen to establish care.  History reviewed. History of osteoarthritis, migraine headaches, hypertension, hyperlipidemia, Peyronie's disease, and attention deficit disorder. He's taken Ritalin for several years and this seems to work well no adverse side effects. Had multiple orthopedic surgeries including right rotator cuff repair 2013 in January and left and right thumb surgeries related to osteoarthritis.  Patient is divorced. Smokes 1 packs a day. Usually one to 2 alcoholic beverages per day. Works as a [employer] manager.  Family history for father with coronary disease in his 8s. Probable hypertension. Both parents hyperlipidemia. Brother with type 2 diabetes.  Review of Systems  Constitutional: Negative for fever, activity change, appetite change and fatigue.  HENT: Negative for ear pain, congestion and trouble swallowing.   Eyes: Negative for pain and visual disturbance.  Respiratory: Negative for cough, shortness of breath and wheezing.   Cardiovascular: Negative for chest pain and palpitations.  Gastrointestinal: Negative for nausea, vomiting, abdominal pain, diarrhea, constipation, blood in stool, abdominal distention and rectal pain.  Genitourinary: Negative for dysuria, hematuria and testicular pain.  Musculoskeletal: Negative for joint swelling and arthralgias.  Skin: Negative for rash.  Neurological: Negative for dizziness, syncope and headaches.  Hematological: Negative for adenopathy.  Psychiatric/Behavioral: Negative for confusion and dysphoric mood.       Objective:   Physical Exam  Constitutional: He is oriented to person, place, and time. He appears well-developed and well-nourished.  HENT:  Mouth/Throat: Oropharynx is clear and moist.  Neck: Neck supple. No thyromegaly present.  Cardiovascular: Normal rate and regular rhythm.   Pulmonary/Chest: Effort normal and  breath sounds normal. No respiratory distress. He has no wheezes. He has no rales.  Musculoskeletal: He exhibits no edema.  Lymphadenopathy:    He has no cervical adenopathy.  Neurological: He is alert and oriented to person, place, and time. No cranial nerve deficit.          Assessment & Plan:  #1 hypertension. Stable. Refill medication for one year #2 history of attention deficit disorder. Refill Ritalin for 3 months #3 history of hyperlipidemia #4 history of migraine headaches

## 2011-05-16 NOTE — Patient Instructions (Signed)
Consider complete physical by October of this year

## 2011-05-22 ENCOUNTER — Ambulatory Visit: Payer: Self-pay | Admitting: Family Medicine

## 2011-09-17 ENCOUNTER — Telehealth: Payer: Self-pay | Admitting: Family Medicine

## 2011-09-17 NOTE — Telephone Encounter (Signed)
Patient called stating that he need a refill of his ritalin and buspar. Please assist.

## 2011-09-17 NOTE — Telephone Encounter (Signed)
Ritalin last filled on 05-16-11 #30 with 3 refills  Pt has CPX scheduled 10/03/11

## 2011-09-17 NOTE — Telephone Encounter (Signed)
Refill ritalin for 3 months and Buspar for 6 months.

## 2011-09-18 MED ORDER — METHYLPHENIDATE HCL ER (LA) 20 MG PO CP24: 20.0000 mg | ORAL_CAPSULE | ORAL | Status: DC

## 2011-09-18 MED ORDER — BUSPIRONE HCL 15 MG PO TABS: 15.0000 mg | ORAL_TABLET | ORAL | Status: DC | PRN

## 2011-09-18 NOTE — Telephone Encounter (Signed)
Pt informed on VM Rx ready to pick-up 

## 2011-09-26 ENCOUNTER — Other Ambulatory Visit (INDEPENDENT_AMBULATORY_CARE_PROVIDER_SITE_OTHER): Payer: BC Managed Care – PPO

## 2011-09-26 DIAGNOSIS — Z0000 Encounter for general adult medical examination without abnormal findings: Secondary | ICD-10-CM

## 2011-09-26 LAB — LIPID PANEL
Cholesterol: 214 mg/dL — ABNORMAL HIGH (ref 0–200)
HDL: 45.4 mg/dL (ref >39.00)
Triglycerides: 293 mg/dL — ABNORMAL HIGH (ref 0.0–149.0)

## 2011-09-26 LAB — BASIC METABOLIC PANEL
CO2: 29 mEq/L (ref 19–32)
Calcium: 9.8 mg/dL (ref 8.4–10.5)
Chloride: 101 mEq/L (ref 96–112)
Glucose, Bld: 106 mg/dL — ABNORMAL HIGH (ref 70–99)
Sodium: 139 mEq/L (ref 135–145)

## 2011-09-26 LAB — HEPATIC FUNCTION PANEL
Albumin: 4.2 g/dL (ref 3.5–5.2)
Total Protein: 6.8 g/dL (ref 6.0–8.3)

## 2011-09-26 LAB — POCT URINALYSIS DIPSTICK
Bilirubin, UA: NEGATIVE
Ketones, UA: NEGATIVE
Leukocytes, UA: NEGATIVE
Spec Grav, UA: 1.025
pH, UA: 5.5

## 2011-09-26 LAB — CBC WITH DIFFERENTIAL/PLATELET
Basophils Absolute: 0 10*3/uL (ref 0.0–0.1)
Basophils Relative: 0.5 % (ref 0.0–3.0)
Eosinophils Absolute: 0.2 10*3/uL (ref 0.0–0.7)
Hemoglobin: 17 g/dL (ref 13.0–17.0)
Lymphocytes Relative: 28.2 % (ref 12.0–46.0)
Lymphs Abs: 2.1 10*3/uL (ref 0.7–4.0)
MCHC: 33.2 g/dL (ref 30.0–36.0)
MCV: 91.8 fl (ref 78.0–100.0)
Monocytes Absolute: 0.9 10*3/uL (ref 0.1–1.0)
Neutro Abs: 4.1 10*3/uL (ref 1.4–7.7)
RBC: 5.59 Mil/uL (ref 4.22–5.81)
RDW: 14.2 % (ref 11.5–14.6)

## 2011-09-26 LAB — TSH: TSH: 2.82 u[IU]/mL (ref 0.35–5.50)

## 2011-10-03 ENCOUNTER — Encounter: Payer: Self-pay | Admitting: Family Medicine

## 2011-10-03 ENCOUNTER — Ambulatory Visit (INDEPENDENT_AMBULATORY_CARE_PROVIDER_SITE_OTHER): Payer: BC Managed Care – PPO | Admitting: Family Medicine

## 2011-10-03 VITALS — BP 130/70 | HR 80 | Temp 99.0°F | Resp 12 | Ht 66.75 in | Wt 208.0 lb

## 2011-10-03 DIAGNOSIS — D239 Other benign neoplasm of skin, unspecified: Secondary | ICD-10-CM

## 2011-10-03 DIAGNOSIS — Z0000 Encounter for general adult medical examination without abnormal findings: Secondary | ICD-10-CM

## 2011-10-03 DIAGNOSIS — Z299 Encounter for prophylactic measures, unspecified: Secondary | ICD-10-CM

## 2011-10-03 DIAGNOSIS — I951 Orthostatic hypotension: Secondary | ICD-10-CM

## 2011-10-03 DIAGNOSIS — R319 Hematuria, unspecified: Secondary | ICD-10-CM

## 2011-10-03 DIAGNOSIS — D229 Melanocytic nevi, unspecified: Secondary | ICD-10-CM

## 2011-10-03 MED ORDER — ZOSTER VACCINE LIVE 19400 UNT/0.65ML ~~LOC~~ SOLR: 0.6500 mL | Freq: Once | SUBCUTANEOUS | Status: DC

## 2011-10-03 MED ORDER — BUPROPION HCL ER (SMOKING DET) 150 MG PO TB12: 150.0000 mg | ORAL_TABLET | Freq: Two times a day (BID) | ORAL | Status: AC

## 2011-10-03 MED ORDER — AMLODIPINE-OLMESARTAN 5-40 MG PO TABS: 1.0000 | ORAL_TABLET | Freq: Every day | ORAL | Status: DC

## 2011-10-03 NOTE — Progress Notes (Signed)
Subjective:    Patient ID: John Best, male    DOB: August 31, 1955, 56 y.o.   MRN: [id number]  HPI  Patient for complete physical examination. History of hypertension, hyperlipidemia, migraine headaches, and attention deficit disorder. He takes combination blood pressure medication with 3 different drugs in 1 (Tribenzor). He's recently had several episodes of what sounds like orthostasis, especially when working outdoors with increased perspiration. No actual syncope.  Ongoing nicotine use. Smokes one pack per day. Requesting Zyban which his insurance will cover. He has taken Wellbutrin in the past without difficulty. Also requesting shingles vaccine. His insurance covers. Last tetanus about 2 years ago. Colonoscopy at age 67 reportedly normal.  Past Medical History  Diagnosis Date  . Hypertension   . Arthritis   . Depression   . Headache   . Hyperlipidemia    Past Surgical History  Procedure Date  . Other surgical history     bilateral thumb fusions   . Thumb fusion 2005  . Shoulder surgery 2013    reports that he has been smoking Cigarettes.  He has a 30 pack-year smoking history. He has never used smokeless tobacco. He reports that he drinks alcohol. He reports that he does not use illicit drugs. family history includes Diabetes in his brother; Heart disease (age of onset:80) in his father; Hyperlipidemia in his father and mother; and Hypertension in his father. No Known Allergies    Review of Systems  Constitutional: Negative for fever, activity change, appetite change, fatigue and unexpected weight change.  HENT: Negative for ear pain, congestion and trouble swallowing.   Eyes: Negative for pain and visual disturbance.  Respiratory: Negative for cough, shortness of breath and wheezing.   Cardiovascular: Negative for chest pain and palpitations.  Gastrointestinal: Negative for nausea, vomiting, abdominal pain, diarrhea, constipation, blood in stool, abdominal distention and  rectal pain.  Genitourinary: Negative for dysuria, hematuria and testicular pain.  Musculoskeletal: Negative for joint swelling and arthralgias.  Skin: Negative for rash.  Neurological: Positive for dizziness. Negative for syncope and headaches.  Hematological: Negative for adenopathy.  Psychiatric/Behavioral: Negative for confusion and dysphoric mood.       Objective:   Physical Exam  Constitutional: He is oriented to person, place, and time. He appears well-developed and well-nourished. No distress.  HENT:  Head: Normocephalic and atraumatic.  Right Ear: External ear normal.  Left Ear: External ear normal.  Mouth/Throat: Oropharynx is clear and moist.  Eyes: Conjunctivae and EOM are normal. Pupils are equal, round, and reactive to light.  Neck: Normal range of motion. Neck supple. No thyromegaly present.  Cardiovascular: Normal rate, regular rhythm and normal heart sounds.   No murmur heard. Pulmonary/Chest: No respiratory distress. He has no wheezes. He has no rales.  Abdominal: Soft. Bowel sounds are normal. He exhibits no distension and no mass. There is no tenderness. There is no rebound and no guarding.  Musculoskeletal: He exhibits no edema.  Lymphadenopathy:    He has no cervical adenopathy.  Neurological: He is alert and oriented to person, place, and time. He displays normal reflexes. No cranial nerve deficit.  Skin: No rash noted.       Patient has nevus left upper back which is only about 5 mm diameter but has some slight minimal asymmetry. Fairly distinct border. Some color variegation.  Psychiatric: He has a normal mood and affect.          Assessment & Plan:  #1 complete physical. Shingles vaccine given. Tetanus reportedly up-to-date. Smoking  cessation discussed. Trial of Zyban. Labs reviewed with patient. Prediabetes. Work on weight loss. Reduce alcohol consumption. #2 trace hematuria on urine dipstick. Repeat urinalysis at follow up. #3 probable orthostasis  by history. Change blood pressure medication to Azor. Reassess one month #4 atypical nevus upper back. Return in one month and shave excision at that time

## 2011-10-03 NOTE — Patient Instructions (Signed)
Smoking Cessation This document explains the best ways for you to quit smoking and new treatments to help. It lists new medicines that can double or triple your chances of quitting and quitting for good. It also considers ways to avoid relapses and concerns you may have about quitting, including weight gain. NICOTINE: A POWERFUL ADDICTION If you have tried to quit smoking, you know how hard it can be. It is hard because nicotine is a very addictive drug. For some people, it can be as addictive as heroin or cocaine. Usually, people make 2 or 3 tries, or more, before finally being able to quit. Each time you try to quit, you can learn about what helps and what hurts. Quitting takes hard work and a lot of effort, but you can quit smoking. QUITTING SMOKING IS ONE OF THE MOST IMPORTANT THINGS YOU WILL EVER DO.  You will live longer, feel better, and live better.   The impact on your body of quitting smoking is felt almost immediately:   Within 20 minutes, blood pressure decreases. Pulse returns to its normal level.   After 8 hours, carbon monoxide levels in the blood return to normal. Oxygen level increases.   After 24 hours, chance of heart attack starts to decrease. Breath, hair, and body stop smelling like smoke.   After 48 hours, damaged nerve endings begin to recover. Sense of taste and smell improve.   After 72 hours, the body is virtually free of nicotine. Bronchial tubes relax and breathing becomes easier.   After 2 to 12 weeks, lungs can hold more air. Exercise becomes easier and circulation improves.   Quitting will reduce your risk of having a heart attack, stroke, cancer, or lung disease:   After 1 year, the risk of coronary heart disease is cut in half.   After 5 years, the risk of stroke falls to the same as a nonsmoker.   After 10 years, the risk of lung cancer is cut in half and the risk of other cancers decreases significantly.   After 15 years, the risk of coronary heart  disease drops, usually to the level of a nonsmoker.   If you are pregnant, quitting smoking will improve your chances of having a healthy baby.   The people you live with, especially your children, will be healthier.   You will have extra money to spend on things other than cigarettes.  FIVE KEYS TO QUITTING Studies have shown that these 5 steps will help you quit smoking and quit for good. You have the best chances of quitting if you use them together: 1. Get ready.  2. Get support and encouragement.  3. Learn new skills and behaviors.  4. Get medicine to reduce your nicotine addiction and use it correctly.  5. Be prepared for relapse or difficult situations. Be determined to continue trying to quit, even if you do not succeed at first.  1. GET READY  Set a quit date.   Change your environment.   Get rid of ALL cigarettes, ashtrays, matches, and lighters in your home, car, and place of work.   Do not let people smoke in your home.   Review your past attempts to quit. Think about what worked and what did not.   Once you quit, do not smoke. NOT EVEN A PUFF!  2. GET SUPPORT AND ENCOURAGEMENT Studies have shown that you have a better chance of being successful if you have help. You can get support in many ways.  Tell   your family, friends, and coworkers that you are going to quit and need their support. Ask them not to smoke around you.   Talk to your caregivers (doctor, dentist, nurse, pharmacist, psychologist, and/or smoking counselor).   Get individual, group, or telephone counseling and support. The more counseling you have, the better your chances are of quitting. Programs are available at local hospitals and health centers. Call your local health department for information about programs in your area.   Spiritual beliefs and practices may help some smokers quit.   Quit meters are small computer programs online or downloadable that keep track of quit statistics, such as amount  of "quit-time," cigarettes not smoked, and money saved.   Many smokers find one or more of the many self-help books available useful in helping them quit and stay off tobacco.  3. LEARN NEW SKILLS AND BEHAVIORS  Try to distract yourself from urges to smoke. Talk to someone, go for a walk, or occupy your time with a task.   When you first try to quit, change your routine. Take a different route to work. Drink tea instead of coffee. Eat breakfast in a different place.   Do something to reduce your stress. Take a hot bath, exercise, or read a book.   Plan something enjoyable to do every day. Reward yourself for not smoking.   Explore interactive web-based programs that specialize in helping you quit.  4. GET MEDICINE AND USE IT CORRECTLY Medicines can help you stop smoking and decrease the urge to smoke. Combining medicine with the above behavioral methods and support can quadruple your chances of successfully quitting smoking. The [place] Food and Drug Administration (FDA) has approved 7 medicines to help you quit smoking. These medicines fall into 3 categories.  Nicotine replacement therapy (delivers nicotine to your body without the negative effects and risks of smoking):   Nicotine gum: Available over-the-counter.   Nicotine lozenges: Available over-the-counter.   Nicotine inhaler: Available by prescription.   Nicotine nasal spray: Available by prescription.   Nicotine skin patches (transdermal): Available by prescription and over-the-counter.   Antidepressant medicine (helps people abstain from smoking, but how this works is unknown):   Bupropion sustained-release (SR) tablets: Available by prescription.   Nicotinic receptor partial agonist (simulates the effect of nicotine in your brain):   Varenicline tartrate tablets: Available by prescription.   Ask your caregiver for advice about which medicines to use and how to use them. Carefully read the information on the package.    Everyone who is trying to quit may benefit from using a medicine. If you are pregnant or trying to become pregnant, nursing an infant, you are under age 18, or you smoke fewer than 10 cigarettes per day, talk to your caregiver before taking any nicotine replacement medicines.   You should stop using a nicotine replacement product and call your caregiver if you experience nausea, dizziness, weakness, vomiting, fast or irregular heartbeat, mouth problems with the lozenge or gum, or redness or swelling of the skin around the patch that does not go away.   Do not use any other product containing nicotine while using a nicotine replacement product.   Talk to your caregiver before using these products if you have diabetes, heart disease, asthma, stomach ulcers, you had a recent heart attack, you have high blood pressure that is not controlled with medicine, a history of irregular heartbeat, or you have been prescribed medicine to help you quit smoking.  5. BE PREPARED FOR RELAPSE OR   DIFFICULT SITUATIONS  Most relapses occur within the first 3 months after quitting. Do not be discouraged if you start smoking again. Remember, most people try several times before they finally quit.   You may have symptoms of withdrawal because your body is used to nicotine. You may crave cigarettes, be irritable, feel very hungry, cough often, get headaches, or have difficulty concentrating.   The withdrawal symptoms are only temporary. They are strongest when you first quit, but they will go away within 10 to 14 days.  Here are some difficult situations to watch for:  Alcohol. Avoid drinking alcohol. Drinking lowers your chances of successfully quitting.   Caffeine. Try to reduce the amount of caffeine you consume. It also lowers your chances of successfully quitting.   Other smokers. Being around smoking can make you want to smoke. Avoid smokers.   Weight gain. Many smokers will gain weight when they quit, usually  less than 10 pounds. Eat a healthy diet and stay active. Do not let weight gain distract you from your main goal, quitting smoking. Some medicines that help you quit smoking may also help delay weight gain. You can always lose the weight gained after you quit.   Bad mood or depression. There are a lot of ways to improve your mood other than smoking.  If you are having problems with any of these situations, talk to your caregiver. SPECIAL SITUATIONS AND CONDITIONS Studies suggest that everyone can quit smoking. Your situation or condition can give you a special reason to quit.  Pregnant women/new mothers: By quitting, you protect your baby's health and your own.   Hospitalized patients: By quitting, you reduce health problems and help healing.   Heart attack patients: By quitting, you reduce your risk of a second heart attack.   Lung, head, and neck cancer patients: By quitting, you reduce your chance of a second cancer.   Parents of children and adolescents: By quitting, you protect your children from illnesses caused by secondhand smoke.  QUESTIONS TO THINK ABOUT Think about the following questions before you try to stop smoking. You may want to talk about your answers with your caregiver.  Why do you want to quit?   If you tried to quit in the past, what helped and what did not?   What will be the most difficult situations for you after you quit? How will you plan to handle them?   Who can help you through the tough times? Your family? Friends? Caregiver?   What pleasures do you get from smoking? What ways can you still get pleasure if you quit?  Here are some questions to ask your caregiver:  How can you help me to be successful at quitting?   What medicine do you think would be best for me and how should I take it?   What should I do if I need more help?   What is smoking withdrawal like? How can I get information on withdrawal?  Quitting takes hard work and a lot of effort,  but you can quit smoking. FOR MORE INFORMATION  Smokefree.gov (http://www.smokefree.gov) provides free, accurate, evidence-based information and professional assistance to help support the immediate and long-term needs of people trying to quit smoking. Document Released: 01/30/2001 Document Revised: 01/25/2011 Document Reviewed: 11/22/2008 [employer] Patient Information 2012 [employer]. 

## 2011-11-02 ENCOUNTER — Ambulatory Visit: Payer: Self-pay | Admitting: Family Medicine

## 2011-11-05 ENCOUNTER — Encounter: Payer: Self-pay | Admitting: Family Medicine

## 2011-11-05 ENCOUNTER — Ambulatory Visit (INDEPENDENT_AMBULATORY_CARE_PROVIDER_SITE_OTHER): Payer: BC Managed Care – PPO | Admitting: Family Medicine

## 2011-11-05 VITALS — BP 118/70 | Temp 98.1°F | Wt 213.0 lb

## 2011-11-05 DIAGNOSIS — I10 Essential (primary) hypertension: Secondary | ICD-10-CM

## 2011-11-05 DIAGNOSIS — D229 Melanocytic nevi, unspecified: Secondary | ICD-10-CM

## 2011-11-05 DIAGNOSIS — Z23 Encounter for immunization: Secondary | ICD-10-CM

## 2011-11-05 DIAGNOSIS — D239 Other benign neoplasm of skin, unspecified: Secondary | ICD-10-CM

## 2011-11-05 DIAGNOSIS — R319 Hematuria, unspecified: Secondary | ICD-10-CM

## 2011-11-05 LAB — POCT URINALYSIS DIPSTICK
Clarity, UA: NEGATIVE
Glucose, UA: NEGATIVE
Ketones, UA: 1.015
Leukocytes, UA: NEGATIVE
Protein, UA: NEGATIVE

## 2011-11-05 LAB — URINALYSIS, ROUTINE W REFLEX MICROSCOPIC
Bilirubin Urine: NEGATIVE
Leukocytes, UA: NEGATIVE
Nitrite: NEGATIVE
pH: 5.5 (ref 5.0–8.0)

## 2011-11-05 NOTE — Patient Instructions (Addendum)
Keep wound dry for the first 24 hours then clean daily with soap and water for one week. Apply topical antibiotic daily for 3-4 days. Keep covered with clean dressing for 4-5 days. Follow up promptly for any signs of infection such as redness, warmth, pain, or drainage.  

## 2011-11-05 NOTE — Progress Notes (Signed)
  Subjective:    Patient ID: John Best, male    DOB: Jun 24, 1955, 56 y.o.   MRN: [id number]  HPI  Patient for followup. Several items as below  Hypertension. Recently was on combination 3 medications in one pill and was having some orthostasis and fatigue. We dropped his diuretic and he has felt much better. Blood pressures have been well controlled around 120/70. No orthostasis.  Recently had trace blood on urine dipstick. No gross hematuria. Patient trying to quit smoking and currently taking Zyban. No dysuria  Atypical nevus noted on the upper mid back. Here for shave excision. His parents both had some type of skin cancer but no history of melanoma.  Past Medical History  Diagnosis Date  . Hypertension   . Arthritis   . Depression   . Headache   . Hyperlipidemia    Past Surgical History  Procedure Date  . Other surgical history     bilateral thumb fusions   . Thumb fusion 2005  . Shoulder surgery 2013    reports that he has been smoking Cigarettes.  He has a 30 pack-year smoking history. He has never used smokeless tobacco. He reports that he drinks alcohol. He reports that he does not use illicit drugs. family history includes Diabetes in his brother; Heart disease (age of onset:80) in his father; Hyperlipidemia in his father and mother; and Hypertension in his father. No Known Allergies    Review of Systems  Constitutional: Negative for fatigue.  Eyes: Negative for visual disturbance.  Respiratory: Negative for cough, chest tightness and shortness of breath.   Cardiovascular: Negative for chest pain, palpitations and leg swelling.  Neurological: Negative for dizziness, syncope, weakness, light-headedness and headaches.       Objective:   Physical Exam  Constitutional: He appears well-developed and well-nourished.  Cardiovascular: Normal rate and regular rhythm.   Pulmonary/Chest: Effort normal and breath sounds normal. No respiratory distress. He has no  wheezes. He has no rales.  Skin:       Mid upper thoracic region reveals nevus which is about 5-6 mm diameter. Some color variegation. Minimal asymmetry. Slightly jagged border.          Assessment & Plan:  #1 hypertension. Stable. Continue current blood pressure medication #2 hematuria on urine dipstick. Repeat urine today. If still has hematuria consider urine micro #3 atypical nevus. Recommend shave excision for further evaluation. Discussed risk and benefits with patient.  Discussed risks and benefits of shave excision including risks of bleeding, bruising, scar formation, and infection and patient consented to proceed.  Skin prepped with betadine and alcohol and shave excision of lesion (following anesthesia with 1% xylocaine with epinephrine) with #15 blade with minimal bleeding.  Patient tolerated well.  Antibiotic and dressing  applied.

## 2011-11-06 NOTE — Progress Notes (Signed)
Quick Note:  Called and spoke with pt and pt is aware. ______ 

## 2011-11-09 NOTE — Progress Notes (Signed)
Quick Note:  Pt informed on personally identified cell VM ______ 

## 2011-12-17 ENCOUNTER — Ambulatory Visit (INDEPENDENT_AMBULATORY_CARE_PROVIDER_SITE_OTHER): Payer: BC Managed Care – PPO | Admitting: Family Medicine

## 2011-12-17 ENCOUNTER — Encounter: Payer: Self-pay | Admitting: Family Medicine

## 2011-12-17 VITALS — BP 130/78 | Temp 98.0°F | Wt 212.0 lb

## 2011-12-17 DIAGNOSIS — F988 Other specified behavioral and emotional disorders with onset usually occurring in childhood and adolescence: Secondary | ICD-10-CM

## 2011-12-17 DIAGNOSIS — I10 Essential (primary) hypertension: Secondary | ICD-10-CM

## 2011-12-17 DIAGNOSIS — D239 Other benign neoplasm of skin, unspecified: Secondary | ICD-10-CM

## 2011-12-17 MED ORDER — METHYLPHENIDATE HCL ER (LA) 20 MG PO CP24: 20.0000 mg | ORAL_CAPSULE | ORAL | Status: DC

## 2011-12-17 NOTE — Patient Instructions (Signed)
Keep wound dry for the first 24 hours then clean daily with soap and water for one week. Apply topical antibiotic daily for 3-4 days. Keep covered with clean dressing for 4-5 days. Follow up promptly for any signs of infection such as redness, warmth, pain, or drainage.  

## 2011-12-17 NOTE — Progress Notes (Signed)
  Subjective:    Patient ID: John Best, male    DOB: Jul 30, 1955, 56 y.o.   MRN: [id number]  HPI  Patient here for the following issues:  Requesting refills of Ritalin. He takes 20 mg long-acting Ritalin. This seems to be working well. No side effects. Blood pressures been better well controlled current medication. No headaches. No dizziness. No chest pains.  Recent dysplastic nevus mid upper back. Severe atypia. Given severe atypia we recommend a followup for elliptical excision and is here for that today. Good healing from previous biopsy site.  Past Medical History  Diagnosis Date  . Hypertension   . Arthritis   . Depression   . Headache   . Hyperlipidemia    Past Surgical History  Procedure Date  . Other surgical history     bilateral thumb fusions   . Thumb fusion 2005  . Shoulder surgery 2013    reports that he has been smoking Cigarettes.  He has a 30 pack-year smoking history. He has never used smokeless tobacco. He reports that he drinks alcohol. He reports that he does not use illicit drugs. family history includes Diabetes in his brother; Heart disease (age of onset:80) in his father; Hyperlipidemia in his father and mother; and Hypertension in his father. No Known Allergies    Review of Systems  Constitutional: Negative for fatigue.  Eyes: Negative for visual disturbance.  Respiratory: Negative for cough, chest tightness and shortness of breath.   Cardiovascular: Negative for chest pain, palpitations and leg swelling.  Neurological: Negative for dizziness, syncope, weakness, light-headedness and headaches.       Objective:   Physical Exam  Constitutional: He appears well-developed and well-nourished. No distress.  Cardiovascular: Normal rate and regular rhythm.   No murmur heard. Skin:       Patient has small scar mid upper thoracic back region which is 6-7 mm diameter          Assessment & Plan:  #1 severely dysplastic nevus recently removed by  shave excision. Here today for elliptical excision. Discussed risk and benefits of excision and patient consents.  Skin prepped with Betadine. Anesthesia with 1% Xylocaine with epinephrine. We made approximately 2 cm horizontal elliptical excision around scar site. Minimal bleeding. Wound closed with 4-0 Ethilon without difficulty. Antibiotic applied. Specimen sent to pathology for further evaluation. Wound care instruction given #2 attention deficit disorder. Refill Ritalin for 3 months  #3 hypertension stable and improved. Continue current medication

## 2011-12-20 NOTE — Progress Notes (Signed)
Quick Note:  Pt informed on personally identified VM ______ 

## 2011-12-28 ENCOUNTER — Ambulatory Visit (INDEPENDENT_AMBULATORY_CARE_PROVIDER_SITE_OTHER): Payer: BC Managed Care – PPO | Admitting: Family Medicine

## 2011-12-28 ENCOUNTER — Encounter: Payer: Self-pay | Admitting: Family Medicine

## 2011-12-28 VITALS — Temp 98.1°F

## 2011-12-28 DIAGNOSIS — D235 Other benign neoplasm of skin of trunk: Secondary | ICD-10-CM

## 2011-12-28 NOTE — Progress Notes (Signed)
  Subjective:    Patient ID: John Best, male    DOB: [birth year], 56 y.o.   MRN: [id number]  HPI  Patient had recent elliptical biopsy. 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 No residual abnormal cells from excisional bx.. He has healed well with no difficulties.   Review of Systems  Constitutional: Negative for fever and chills.       Objective:   Physical Exam  Constitutional: He appears well-developed and well-nourished.  Skin:       Well healed wound upper back. Sutures removed without difficulty. No erythema. No drainage.          Assessment & Plan:  Recent elliptical excision for severely dysplastic nevus. No problems with healing. No secondary infection. Sutures removed. Followup as needed

## 2012-04-08 ENCOUNTER — Telehealth: Payer: Self-pay | Admitting: Family Medicine

## 2012-04-08 MED ORDER — METHYLPHENIDATE HCL ER (LA) 20 MG PO CP24: 20.0000 mg | ORAL_CAPSULE | ORAL | Status: DC

## 2012-04-08 NOTE — Telephone Encounter (Signed)
Pt needs new rxs ritalin 20 mg. Pt is requesting separate rxs for next 3 months

## 2012-04-08 NOTE — Telephone Encounter (Signed)
Pt informed Rx ready to pick-up on VM 

## 2012-04-08 NOTE — Telephone Encounter (Signed)
Refill OK

## 2012-04-08 NOTE — Telephone Encounter (Signed)
Ritalin daily last filled 12-17-11, #30 with 2 refills Last OV 12-28-11

## 2012-07-22 ENCOUNTER — Telehealth: Payer: Self-pay | Admitting: Family Medicine

## 2012-07-22 NOTE — Telephone Encounter (Signed)
PT called to request a 3 month refill of his methylphenidate (RITALIN LA) 20 MG 24 hr capsule. Please assist.

## 2012-07-23 NOTE — Telephone Encounter (Signed)
Okay to refill Ritalin.

## 2012-07-23 NOTE — Telephone Encounter (Signed)
OK to refill

## 2012-07-24 MED ORDER — METHYLPHENIDATE HCL ER (LA) 20 MG PO CP24: 20.0000 mg | ORAL_CAPSULE | ORAL | Status: DC

## 2012-07-24 NOTE — Telephone Encounter (Signed)
Left message on voicemail to call office. Rx ready for pick up.

## 2012-07-24 NOTE — Telephone Encounter (Signed)
Patient notified to pick up at the front desk.  He stated he will pick it up this evening.

## 2012-10-08 ENCOUNTER — Other Ambulatory Visit: Payer: Self-pay | Admitting: Family Medicine

## 2012-11-10 ENCOUNTER — Telehealth: Payer: Self-pay | Admitting: Family Medicine

## 2012-11-10 MED ORDER — METHYLPHENIDATE HCL ER (LA) 20 MG PO CP24: 20.0000 mg | ORAL_CAPSULE | ORAL | Status: DC

## 2012-11-10 NOTE — Telephone Encounter (Signed)
Pt informed

## 2012-11-10 NOTE — Telephone Encounter (Signed)
Last visit 12/28/2011 Last refill 07/24/12 #30 2 refills

## 2012-11-10 NOTE — Telephone Encounter (Signed)
Pt is calling to request a 3 month refill of his methylphenidate (RITALIN LA) 20 MG 24 hr capsule. Please assist.

## 2012-11-10 NOTE — Telephone Encounter (Signed)
Refill OK.  Will need office follow up within 2 months,

## 2012-11-17 ENCOUNTER — Encounter: Payer: Self-pay | Admitting: Family Medicine

## 2012-11-17 ENCOUNTER — Ambulatory Visit (INDEPENDENT_AMBULATORY_CARE_PROVIDER_SITE_OTHER): Payer: BC Managed Care – PPO | Admitting: Family Medicine

## 2012-11-17 VITALS — BP 124/68 | HR 83 | Temp 98.1°F | Wt 221.0 lb

## 2012-11-17 DIAGNOSIS — I10 Essential (primary) hypertension: Secondary | ICD-10-CM

## 2012-11-17 DIAGNOSIS — Z23 Encounter for immunization: Secondary | ICD-10-CM

## 2012-11-17 MED ORDER — AMLODIPINE-OLMESARTAN 5-40 MG PO TABS: 1.0000 | ORAL_TABLET | Freq: Every day | ORAL | Status: DC

## 2012-11-17 MED ORDER — BUPROPION HCL ER (SR) 150 MG PO TB12: 150.0000 mg | ORAL_TABLET | Freq: Two times a day (BID) | ORAL | Status: DC

## 2012-11-17 NOTE — Progress Notes (Signed)
  Subjective:    Patient ID: John Best, male    DOB: [birth year], 57 y.o.   MRN: [id number]  HPI Patient here for followup regarding hypertension. He currently takes combination drug with amlodipine and Benicar Blood pressures been well controlled. Compliant with therapy. No dizziness. No headaches. No chest pains. Patient still smokes half-pack cigarettes per day or less and is trying to quit. Requesting refills of Wellbutrin which is helped in the past. He remains on Ritalin LA for ADD in this is working well. Requesting flu vaccine.  Past Medical History  Diagnosis Date  . Hypertension   . Arthritis   . Depression   . Headache(784.0)   . Hyperlipidemia    Past Surgical History  Procedure Laterality Date  . Other surgical history      bilateral thumb fusions   . Thumb fusion  2005  . Shoulder surgery  2013    reports that he has been smoking Cigarettes.  He has a 30 pack-year smoking history. He has never used smokeless tobacco. He reports that  drinks alcohol. He reports that he does not use illicit drugs. family history includes Diabetes in his brother; Heart disease (age of onset: 1) in his father; Hyperlipidemia in his father and mother; Hypertension in his father. No Known Allergies    Review of Systems  Constitutional: Negative for appetite change, fatigue and unexpected weight change.  Eyes: Negative for visual disturbance.  Respiratory: Negative for cough, chest tightness and shortness of breath.   Cardiovascular: Negative for chest pain, palpitations and leg swelling.  Endocrine: Negative for polydipsia and polyuria.  Neurological: Negative for dizziness, syncope, weakness, light-headedness and headaches.       Objective:   Physical Exam  Constitutional: He appears well-developed and well-nourished.  Cardiovascular: Normal rate and regular rhythm.   Pulmonary/Chest: Effort normal and breath sounds normal. No respiratory distress. He has no wheezes. He has  no rales.  Musculoskeletal: He exhibits no edema.          Assessment & Plan:  Hypertension. Well controlled. Refill blood pressure medication for one year. We also refilled Wellbutrin which he has used previously for smoking cessation. Flu vaccine given. He has scheduled complete physical in October

## 2012-12-01 ENCOUNTER — Other Ambulatory Visit: Payer: Self-pay | Admitting: Family Medicine

## 2012-12-01 ENCOUNTER — Other Ambulatory Visit (INDEPENDENT_AMBULATORY_CARE_PROVIDER_SITE_OTHER): Payer: BC Managed Care – PPO

## 2012-12-01 ENCOUNTER — Telehealth: Payer: Self-pay | Admitting: Family Medicine

## 2012-12-01 DIAGNOSIS — Z0000 Encounter for general adult medical examination without abnormal findings: Secondary | ICD-10-CM

## 2012-12-01 LAB — CBC WITH DIFFERENTIAL/PLATELET
Basophils Absolute: 0 10*3/uL (ref 0.0–0.1)
Basophils Relative: 0.2 % (ref 0.0–3.0)
Eosinophils Absolute: 0.2 10*3/uL (ref 0.0–0.7)
Eosinophils Relative: 3 % (ref 0.0–5.0)
Lymphocytes Relative: 26.9 % (ref 12.0–46.0)
MCHC: 34.3 g/dL (ref 30.0–36.0)
Monocytes Relative: 15.1 % — ABNORMAL HIGH (ref 3.0–12.0)
Neutro Abs: 3.9 10*3/uL (ref 1.4–7.7)
Neutrophils Relative %: 54.8 % (ref 43.0–77.0)
RBC: 5.47 Mil/uL (ref 4.22–5.81)
RDW: 13.4 % (ref 11.5–14.6)

## 2012-12-01 LAB — LIPID PANEL
Cholesterol: 192 mg/dL (ref 0–200)
HDL: 42.9 mg/dL (ref >39.00)
VLDL: 28.8 mg/dL (ref 0.0–40.0)

## 2012-12-01 LAB — BASIC METABOLIC PANEL
BUN: 16 mg/dL (ref 6–23)
Calcium: 9.3 mg/dL (ref 8.4–10.5)
Creatinine, Ser: 1 mg/dL (ref 0.4–1.5)
GFR: 81.38 mL/min (ref >60.00)
Glucose, Bld: 99 mg/dL (ref 70–99)
Potassium: 5 mEq/L (ref 3.5–5.1)
Sodium: 139 mEq/L (ref 135–145)

## 2012-12-01 LAB — HEPATIC FUNCTION PANEL
ALT: 49 U/L (ref 0–53)
Bilirubin, Direct: 0.1 mg/dL (ref 0.0–0.3)
Total Protein: 6.8 g/dL (ref 6.0–8.3)

## 2012-12-01 LAB — TSH: TSH: 2.21 u[IU]/mL (ref 0.35–5.50)

## 2012-12-01 NOTE — Telephone Encounter (Signed)
Please place lab orders for [facility] labs. PT going to [facility] today. Thank you!

## 2012-12-01 NOTE — Telephone Encounter (Signed)
Orders placed.

## 2012-12-08 ENCOUNTER — Ambulatory Visit (INDEPENDENT_AMBULATORY_CARE_PROVIDER_SITE_OTHER): Payer: BC Managed Care – PPO | Admitting: Family Medicine

## 2012-12-08 ENCOUNTER — Encounter: Payer: Self-pay | Admitting: Family Medicine

## 2012-12-08 VITALS — BP 126/74 | HR 74 | Temp 98.5°F | Wt 216.0 lb

## 2012-12-08 DIAGNOSIS — Z23 Encounter for immunization: Secondary | ICD-10-CM

## 2012-12-08 DIAGNOSIS — E669 Obesity, unspecified: Secondary | ICD-10-CM | POA: Insufficient documentation

## 2012-12-08 DIAGNOSIS — Z0000 Encounter for general adult medical examination without abnormal findings: Secondary | ICD-10-CM

## 2012-12-08 MED ORDER — VARENICLINE TARTRATE 1 MG PO TABS: 1.0000 mg | ORAL_TABLET | Freq: Two times a day (BID) | ORAL | Status: DC

## 2012-12-08 MED ORDER — VARENICLINE TARTRATE 0.5 MG X 11 & 1 MG X 42 PO MISC: ORAL | Status: DC

## 2012-12-08 NOTE — Progress Notes (Signed)
  Subjective:    Patient ID: John Best, male    DOB: 06/02/1955, 57 y.o.   MRN: [id number]  HPI Patient seen for complete physical. Chronic problems include history of hypertension, hyperlipidemia, ADD Last tetanus is unknown.  He is still smoking but would like to quit. Has tried Wellbutrin recently without much success. Previously took Chantix which seemed to help and he would like to switch over to this. He does not give any history of previous intolerance.  Past Medical History  Diagnosis Date  . Hypertension   . Arthritis   . Depression   . Headache(784.0)   . Hyperlipidemia    Past Surgical History  Procedure Laterality Date  . Other surgical history      bilateral thumb fusions   . Thumb fusion  2005  . Shoulder surgery  2013    reports that he has been smoking Cigarettes.  He has a 30 pack-year smoking history. He has never used smokeless tobacco. He reports that he drinks alcohol. He reports that he does not use illicit drugs. family history includes Diabetes in his brother; Heart disease (age of onset: 66) in his father; Hyperlipidemia in his father and mother; Hypertension in his father. No Known Allergies    Review of Systems  Constitutional: Negative for fever, activity change, appetite change and fatigue.  HENT: Negative for congestion, ear pain and trouble swallowing.   Eyes: Negative for pain and visual disturbance.  Respiratory: Negative for cough, shortness of breath and wheezing.   Cardiovascular: Negative for chest pain and palpitations.  Gastrointestinal: Negative for nausea, vomiting, abdominal pain, diarrhea, constipation, blood in stool, abdominal distention and rectal pain.  Genitourinary: Negative for dysuria, hematuria and testicular pain.  Musculoskeletal: Negative for arthralgias and joint swelling.  Skin: Negative for rash.  Neurological: Negative for dizziness, syncope and headaches.  Hematological: Negative for adenopathy.   Psychiatric/Behavioral: Negative for confusion and dysphoric mood.       Objective:   Physical Exam  Constitutional: He is oriented to person, place, and time. He appears well-developed and well-nourished. No distress.  HENT:  Head: Normocephalic and atraumatic.  Right Ear: External ear normal.  Left Ear: External ear normal.  Mouth/Throat: Oropharynx is clear and moist.  Eyes: Conjunctivae and EOM are normal. Pupils are equal, round, and reactive to light.  Neck: Normal range of motion. Neck supple. No thyromegaly present.  Cardiovascular: Normal rate, regular rhythm and normal heart sounds.   No murmur heard. Pulmonary/Chest: No respiratory distress. He has no wheezes. He has no rales.  Abdominal: Soft. Bowel sounds are normal. He exhibits no distension and no mass. There is no tenderness. There is no rebound and no guarding.  Musculoskeletal: He exhibits no edema.  Lymphadenopathy:    He has no cervical adenopathy.  Neurological: He is alert and oriented to person, place, and time. He displays normal reflexes. No cranial nerve deficit.  Skin: No rash noted.  Psychiatric: He has a normal mood and affect.          Assessment & Plan:  Complete physical. Smoking cessation discussed. Prescription for Chantix provided. Tetanus booster given. Labs reviewed with patient. Discussed weight control and regular exercise. Flu vaccine already given. Zostavax given last year

## 2012-12-08 NOTE — Patient Instructions (Signed)
Smoking Cessation Quitting smoking is important to your health and has many advantages. However, it is not always easy to quit since nicotine is a very addictive drug. Often times, people try 3 times or more before being able to quit. This document explains the best ways for you to prepare to quit smoking. Quitting takes hard work and a lot of effort, but you can do it. ADVANTAGES OF QUITTING SMOKING  You will live longer, feel better, and live better.  Your body will feel the impact of quitting smoking almost immediately.  Within 20 minutes, blood pressure decreases. Your pulse returns to its normal level.  After 8 hours, carbon monoxide levels in the blood return to normal. Your oxygen level increases.  After 24 hours, the chance of having a heart attack starts to decrease. Your breath, hair, and body stop smelling like smoke.  After 48 hours, damaged nerve endings begin to recover. Your sense of taste and smell improve.  After 72 hours, the body is virtually free of nicotine. Your bronchial tubes relax and breathing becomes easier.  After 2 to 12 weeks, lungs can hold more air. Exercise becomes easier and circulation improves.  The risk of having a heart attack, stroke, cancer, or lung disease is greatly reduced.  After 1 year, the risk of coronary heart disease is cut in half.  After 5 years, the risk of stroke falls to the same as a nonsmoker.  After 10 years, the risk of lung cancer is cut in half and the risk of other cancers decreases significantly.  After 15 years, the risk of coronary heart disease drops, usually to the level of a nonsmoker.  If you are pregnant, quitting smoking will improve your chances of having a healthy baby.  The people you live with, especially any children, will be healthier.  You will have extra money to spend on things other than cigarettes. QUESTIONS TO THINK ABOUT BEFORE ATTEMPTING TO QUIT You may want to talk about your answers with your  caregiver.  Why do you want to quit?  If you tried to quit in the past, what helped and what did not?  What will be the most difficult situations for you after you quit? How will you plan to handle them?  Who can help you through the tough times? Your family? Friends? A caregiver?  What pleasures do you get from smoking? What ways can you still get pleasure if you quit? Here are some questions to ask your caregiver:  How can you help me to be successful at quitting?  What medicine do you think would be best for me and how should I take it?  What should I do if I need more help?  What is smoking withdrawal like? How can I get information on withdrawal? GET READY  Set a quit date.  Change your environment by getting rid of all cigarettes, ashtrays, matches, and lighters in your home, car, or work. Do not let people smoke in your home.  Review your past attempts to quit. Think about what worked and what did not. GET SUPPORT AND ENCOURAGEMENT You have a better chance of being successful if you have help. You can get support in many ways.  Tell your family, friends, and co-workers that you are going to quit and need their support. Ask them not to smoke around you.  Get individual, group, or telephone counseling and support. Programs are available at local hospitals and health centers. Call your local health department for   information about programs in your area.  Spiritual beliefs and practices may help some smokers quit.  Download a "quit meter" on your computer to keep track of quit statistics, such as how long you have gone without smoking, cigarettes not smoked, and money saved.  Get a self-help book about quitting smoking and staying off of tobacco. LEARN NEW SKILLS AND BEHAVIORS  Distract yourself from urges to smoke. Talk to someone, go for a walk, or occupy your time with a task.  Change your normal routine. Take a different route to work. Drink tea instead of coffee.  Eat breakfast in a different place.  Reduce your stress. Take a hot bath, exercise, or read a book.  Plan something enjoyable to do every day. Reward yourself for not smoking.  Explore interactive web-based programs that specialize in helping you quit. GET MEDICINE AND USE IT CORRECTLY Medicines can help you stop smoking and decrease the urge to smoke. Combining medicine with the above behavioral methods and support can greatly increase your chances of successfully quitting smoking.  Nicotine replacement therapy helps deliver nicotine to your body without the negative effects and risks of smoking. Nicotine replacement therapy includes nicotine gum, lozenges, inhalers, nasal sprays, and skin patches. Some may be available over-the-counter and others require a prescription.  Antidepressant medicine helps people abstain from smoking, but how this works is unknown. This medicine is available by prescription.  Nicotinic receptor partial agonist medicine simulates the effect of nicotine in your brain. This medicine is available by prescription. Ask your caregiver for advice about which medicines to use and how to use them based on your health history. Your caregiver will tell you what side effects to look out for if you choose to be on a medicine or therapy. Carefully read the information on the package. Do not use any other product containing nicotine while using a nicotine replacement product.  RELAPSE OR DIFFICULT SITUATIONS Most relapses occur within the first 3 months after quitting. Do not be discouraged if you start smoking again. Remember, most people try several times before finally quitting. You may have symptoms of withdrawal because your body is used to nicotine. You may crave cigarettes, be irritable, feel very hungry, cough often, get headaches, or have difficulty concentrating. The withdrawal symptoms are only temporary. They are strongest when you first quit, but they will go away within  10 14 days. To reduce the chances of relapse, try to:  Avoid drinking alcohol. Drinking lowers your chances of successfully quitting.  Reduce the amount of caffeine you consume. Once you quit smoking, the amount of caffeine in your body increases and can give you symptoms, such as a rapid heartbeat, sweating, and anxiety.  Avoid smokers because they can make you want to smoke.  Do not let weight gain distract you. Many smokers will gain weight when they quit, usually less than 10 pounds. Eat a healthy diet and stay active. You can always lose the weight gained after you quit.  Find ways to improve your mood other than smoking. FOR MORE INFORMATION  www.smokefree.gov  Document Released: 01/30/2001 Document Revised: 08/07/2011 Document Reviewed: 05/17/2011 [employer] Patient Information 2014 [employer].  

## 2013-02-04 ENCOUNTER — Telehealth: Payer: Self-pay | Admitting: Family Medicine

## 2013-02-04 NOTE — Telephone Encounter (Signed)
Refill OK

## 2013-02-04 NOTE — Telephone Encounter (Signed)
Last visit 12/08/12 Last refill 11/10/12 #30 2 refills

## 2013-02-04 NOTE — Telephone Encounter (Signed)
Pt is requesting a refill of his methylphenidate (RITALIN LA) 20 MG 24 hr capsule. Please advise.

## 2013-02-05 MED ORDER — METHYLPHENIDATE HCL ER (LA) 20 MG PO CP24: 20.0000 mg | ORAL_CAPSULE | ORAL | Status: DC

## 2013-02-05 NOTE — Telephone Encounter (Signed)
Pt is aware that RX is ready for pick up. 

## 2013-06-03 ENCOUNTER — Telehealth: Payer: Self-pay | Admitting: Family Medicine

## 2013-06-03 MED ORDER — METHYLPHENIDATE HCL ER (LA) 20 MG PO CP24: 20.0000 mg | ORAL_CAPSULE | ORAL | Status: DC

## 2013-06-03 NOTE — Telephone Encounter (Signed)
Refill OK

## 2013-06-03 NOTE — Telephone Encounter (Signed)
Pt informed that RX is ready for pick up 

## 2013-06-03 NOTE — Telephone Encounter (Signed)
Last visit 12/08/12 Last refill 02/05/13 #30 2 refills

## 2013-06-03 NOTE — Telephone Encounter (Signed)
Pt needs new rx methylphenidate 20 mg °

## 2013-09-18 ENCOUNTER — Telehealth: Payer: Self-pay | Admitting: Family Medicine

## 2013-09-18 MED ORDER — METHYLPHENIDATE HCL ER (LA) 20 MG PO CP24: 20.0000 mg | ORAL_CAPSULE | ORAL | Status: DC

## 2013-09-18 NOTE — Telephone Encounter (Signed)
Pt request refill of the following: methylphenidate (RITALIN LA) 20 MG 24 hr capsule    Phamacy:  Pick up

## 2013-09-18 NOTE — Telephone Encounter (Signed)
Pt notified Rx's ready for pickup. Rx's printed and signed by Dr. Elease Hashimoto.

## 2013-09-24 ENCOUNTER — Telehealth: Payer: Self-pay | Admitting: Family Medicine

## 2013-09-24 NOTE — Telephone Encounter (Signed)
Pt called to ask if a form was received from [facility]. Pt need to know so that he can call and let them know form was received .

## 2013-09-24 NOTE — Telephone Encounter (Signed)
Left message on patient Vm

## 2013-09-24 NOTE — Telephone Encounter (Signed)
Pt informed that form is received and that i will fax on Monday when Dr. B returns back to the office.

## 2013-09-24 NOTE — Telephone Encounter (Signed)
Pt informed that form is here in the office.

## 2013-09-24 NOTE — Telephone Encounter (Signed)
Pt stated there was a form fax here on 09-18-13 concerning his adderall and need the form completed asap. Pt would like montrice to return his call today

## 2013-09-29 ENCOUNTER — Telehealth: Payer: Self-pay | Admitting: Family Medicine

## 2013-09-29 MED ORDER — AMLODIPINE-OLMESARTAN 5-40 MG PO TABS: 1.0000 | ORAL_TABLET | Freq: Every day | ORAL | Status: DC

## 2013-09-29 NOTE — Telephone Encounter (Signed)
Rx sent to pharmacy   

## 2013-09-29 NOTE — Telephone Encounter (Signed)
[facility], [address] Is requesting re-fill on amLODipine-olmesartan (AZOR) 5-40 MG per tablet

## 2014-01-04 ENCOUNTER — Telehealth: Payer: Self-pay | Admitting: Family Medicine

## 2014-01-04 ENCOUNTER — Other Ambulatory Visit: Payer: Self-pay | Admitting: Family Medicine

## 2014-01-04 DIAGNOSIS — Z0000 Encounter for general adult medical examination without abnormal findings: Secondary | ICD-10-CM

## 2014-01-04 MED ORDER — METHYLPHENIDATE HCL ER (LA) 20 MG PO CP24: 20.0000 mg | ORAL_CAPSULE | ORAL | Status: DC

## 2014-01-04 NOTE — Telephone Encounter (Signed)
Last visit 12/08/12 Last refill 09/18/13 #30 2 refill

## 2014-01-04 NOTE — Telephone Encounter (Signed)
Pt would like to go to [facility] for his labs. Please place order.

## 2014-01-04 NOTE — Telephone Encounter (Signed)
Needs office follow up .  May refill once only until follow up.

## 2014-01-04 NOTE — Telephone Encounter (Signed)
Needs ritalin refilled. Call when ready for p/u, ok to leave a message.

## 2014-01-04 NOTE — Telephone Encounter (Signed)
Pt is aware that RX is ready for pick up. 

## 2014-01-04 NOTE — Telephone Encounter (Signed)
Labs are ordered 

## 2014-01-06 ENCOUNTER — Ambulatory Visit: Payer: BC Managed Care – PPO

## 2014-01-06 ENCOUNTER — Ambulatory Visit (INDEPENDENT_AMBULATORY_CARE_PROVIDER_SITE_OTHER): Payer: BC Managed Care – PPO

## 2014-01-06 ENCOUNTER — Other Ambulatory Visit (INDEPENDENT_AMBULATORY_CARE_PROVIDER_SITE_OTHER): Payer: BC Managed Care – PPO

## 2014-01-06 DIAGNOSIS — Z23 Encounter for immunization: Secondary | ICD-10-CM

## 2014-01-06 DIAGNOSIS — Z0000 Encounter for general adult medical examination without abnormal findings: Secondary | ICD-10-CM

## 2014-01-06 LAB — CBC WITH DIFFERENTIAL/PLATELET
BASOS ABS: 0 10*3/uL (ref 0.0–0.1)
Basophils Relative: 0.3 % (ref 0.0–3.0)
EOS ABS: 0.2 10*3/uL (ref 0.0–0.7)
Eosinophils Relative: 2.6 % (ref 0.0–5.0)
HCT: 48.5 % (ref 39.0–52.0)
Hemoglobin: 16.1 g/dL (ref 13.0–17.0)
Lymphocytes Relative: 32 % (ref 12.0–46.0)
Lymphs Abs: 2.8 10*3/uL (ref 0.7–4.0)
MCHC: 33.2 g/dL (ref 30.0–36.0)
MCV: 89.2 fl (ref 78.0–100.0)
MONO ABS: 0.9 10*3/uL (ref 0.1–1.0)
Monocytes Relative: 10.5 % (ref 3.0–12.0)
NEUTROS PCT: 54.6 % (ref 43.0–77.0)
Neutro Abs: 4.8 10*3/uL (ref 1.4–7.7)
PLATELETS: 261 10*3/uL (ref 150.0–400.0)
RBC: 5.44 Mil/uL (ref 4.22–5.81)
RDW: 13.9 % (ref 11.5–15.5)
WBC: 8.8 10*3/uL (ref 4.0–10.5)

## 2014-01-06 LAB — HEPATIC FUNCTION PANEL
ALT: 54 U/L — ABNORMAL HIGH (ref 0–53)
AST: 23 U/L (ref 0–37)
Albumin: 4.2 g/dL (ref 3.5–5.2)
Alkaline Phosphatase: 57 U/L (ref 39–117)
Bilirubin, Direct: 0.1 mg/dL (ref 0.0–0.3)
Total Bilirubin: 0.8 mg/dL (ref 0.2–1.2)
Total Protein: 6.7 g/dL (ref 6.0–8.3)

## 2014-01-06 LAB — TSH: TSH: 3.52 u[IU]/mL (ref 0.35–4.50)

## 2014-01-06 LAB — LIPID PANEL
Cholesterol: 221 mg/dL — ABNORMAL HIGH (ref 0–200)
HDL: 49.9 mg/dL (ref >39.00)
LDL Cholesterol: 143 mg/dL — ABNORMAL HIGH (ref 0–99)
NonHDL: 171.1
Total CHOL/HDL Ratio: 4
Triglycerides: 140 mg/dL (ref 0.0–149.0)
VLDL: 28 mg/dL (ref 0.0–40.0)

## 2014-01-06 LAB — POCT URINALYSIS DIPSTICK
Bilirubin, UA: NEGATIVE
Blood, UA: NEGATIVE
Glucose, UA: NEGATIVE
Ketones, UA: NEGATIVE
Leukocytes, UA: NEGATIVE
Nitrite, UA: NEGATIVE
Protein, UA: NEGATIVE
Spec Grav, UA: <=1.005
Urobilinogen, UA: 0.2
pH, UA: 6

## 2014-01-06 LAB — BASIC METABOLIC PANEL
BUN: 15 mg/dL (ref 6–23)
CO2: 29 mEq/L (ref 19–32)
Calcium: 9.3 mg/dL (ref 8.4–10.5)
Chloride: 102 mEq/L (ref 96–112)
Creatinine, Ser: 0.9 mg/dL (ref 0.4–1.5)
GFR: 96.29 mL/min (ref >60.00)
Glucose, Bld: 86 mg/dL (ref 70–99)
Potassium: 4.4 mEq/L (ref 3.5–5.1)
Sodium: 138 mEq/L (ref 135–145)

## 2014-01-06 LAB — PSA: PSA: 0.56 ng/mL (ref 0.10–4.00)

## 2014-01-25 ENCOUNTER — Encounter: Payer: Self-pay | Admitting: Family Medicine

## 2014-01-25 ENCOUNTER — Ambulatory Visit (INDEPENDENT_AMBULATORY_CARE_PROVIDER_SITE_OTHER): Payer: BC Managed Care – PPO | Admitting: Family Medicine

## 2014-01-25 VITALS — BP 136/80 | HR 85 | Temp 98.0°F | Ht 66.0 in | Wt 220.0 lb

## 2014-01-25 DIAGNOSIS — R7401 Elevation of levels of liver transaminase levels: Secondary | ICD-10-CM

## 2014-01-25 DIAGNOSIS — E785 Hyperlipidemia, unspecified: Secondary | ICD-10-CM

## 2014-01-25 DIAGNOSIS — Z129 Encounter for screening for malignant neoplasm, site unspecified: Secondary | ICD-10-CM

## 2014-01-25 DIAGNOSIS — R740 Nonspecific elevation of levels of transaminase and lactic acid dehydrogenase [LDH]: Secondary | ICD-10-CM

## 2014-01-25 DIAGNOSIS — Z23 Encounter for immunization: Secondary | ICD-10-CM

## 2014-01-25 DIAGNOSIS — Z0000 Encounter for general adult medical examination without abnormal findings: Secondary | ICD-10-CM

## 2014-01-25 MED ORDER — METHYLPHENIDATE HCL ER (LA) 20 MG PO CP24: 20.0000 mg | ORAL_CAPSULE | ORAL | Status: DC

## 2014-01-25 MED ORDER — ATORVASTATIN CALCIUM 20 MG PO TABS: 20.0000 mg | ORAL_TABLET | Freq: Every day | ORAL | Status: DC

## 2014-01-25 MED ORDER — BUPROPION HCL ER (SR) 150 MG PO TB12: 150.0000 mg | ORAL_TABLET | Freq: Two times a day (BID) | ORAL | Status: DC

## 2014-01-25 NOTE — Progress Notes (Signed)
Pre visit review using our clinic review tool, if applicable. No additional management support is needed unless otherwise documented below in the visit note. 

## 2014-01-25 NOTE — Progress Notes (Signed)
Subjective:    Patient ID: John Best, male    DOB: Feb 03, 1956, 58 y.o.   MRN: [id number]   HPI   Here for complete physical. He has history of obesity, hyperlipidemia, borderline elevated blood pressure, ADD, and ongoing nicotine use. Currently smokes about one pack cigarettes per day and is hoping to quit. Has previously taken Chantix. He has done well in the past on Wellbutrin and is specifically requesting trial of Wellbutrin. His father died recently of complications of head injury and what sounds like subdural hematoma and possible aspiration pneumonia.  Patient has never had pneumonia vaccination. He does get flu vaccinations. Reportedly contacted by GI for repeat colonoscopy this year. No consistent exercise.  Past Medical History  Diagnosis Date  . Hypertension   . Arthritis   . Depression   . Headache(784.0)   . Hyperlipidemia    Past Surgical History  Procedure Laterality Date  . Other surgical history      bilateral thumb fusions   . Thumb fusion  2005  . Shoulder surgery  2013    reports that he has been smoking Cigarettes.  He has a 30 pack-year smoking history. He has never used smokeless tobacco. He reports that he drinks alcohol. He reports that he does not use illicit drugs. family history includes Diabetes in his brother; Heart disease (age of onset: 71) in his father; Hyperlipidemia in his father and mother; Hypertension in his father. No Known Allergies    Review of Systems  Constitutional: Negative for fever, activity change, appetite change and fatigue.  HENT: Negative for congestion, ear pain and trouble swallowing.   Eyes: Negative for pain and visual disturbance.  Respiratory: Negative for cough, shortness of breath and wheezing.   Cardiovascular: Negative for chest pain and palpitations.  Gastrointestinal: Negative for nausea, vomiting, abdominal pain, diarrhea, constipation, blood in stool, abdominal distention and rectal pain.  Genitourinary:  Negative for dysuria, hematuria and testicular pain.  Musculoskeletal: Negative for joint swelling and arthralgias.  Skin: Negative for rash.  Neurological: Negative for dizziness, syncope and headaches.  Hematological: Negative for adenopathy.  Psychiatric/Behavioral: Negative for confusion and dysphoric mood.       Objective:   Physical Exam  Constitutional: He is oriented to person, place, and time. He appears well-developed and well-nourished. No distress.  HENT:  Head: Normocephalic and atraumatic.  Right Ear: External ear normal.  Left Ear: External ear normal.  Mouth/Throat: Oropharynx is clear and moist.  Eyes: Conjunctivae and EOM are normal. Pupils are equal, round, and reactive to light.  Neck: Normal range of motion. Neck supple. No thyromegaly present.  Cardiovascular: Normal rate, regular rhythm and normal heart sounds.   No murmur heard. Pulmonary/Chest: No respiratory distress. He has no wheezes. He has no rales.  Abdominal: Soft. Bowel sounds are normal. He exhibits no distension and no mass. There is no tenderness. There is no rebound and no guarding.  Musculoskeletal: He exhibits no edema.  Lymphadenopathy:    He has no cervical adenopathy.  Neurological: He is alert and oriented to person, place, and time. He displays normal reflexes. No cranial nerve deficit.  Skin: No rash noted.  Psychiatric: He has a normal mood and affect.          Assessment & Plan:  Health maintenance. We spent quite some time discussing smoking cessation. He wishes to try Wellbutrin 150 mg twice a day which he has tolerated well in the past. Handout on smoking cessation given. Recommended 23 valent  pneumococcal vaccination with smoking history. He agrees to this. We also discussed pros and cons of low-dose lung CT scanning and he wishes to proceed with this. He does meet criteria with over 30 pack year history of smoking, current smoker, and age of 76 He has 20% 10 year risk of CAD  event and we have recommended starting statin therapy based on this. He agrees. Atorvastatin 20 mg once daily. Recheck lipids and hepatic in 2 months

## 2014-01-25 NOTE — Patient Instructions (Signed)
Smoking Cessation Quitting smoking is important to your health and has many advantages. However, it is not always easy to quit since nicotine is a very addictive drug. Oftentimes, people try 3 times or more before being able to quit. This document explains the best ways for you to prepare to quit smoking. Quitting takes hard work and a lot of effort, but you can do it. ADVANTAGES OF QUITTING SMOKING  You will live longer, feel better, and live better.  Your body will feel the impact of quitting smoking almost immediately.  Within 20 minutes, blood pressure decreases. Your pulse returns to its normal level.  After 8 hours, carbon monoxide levels in the blood return to normal. Your oxygen level increases.  After 24 hours, the chance of having a heart attack starts to decrease. Your breath, hair, and body stop smelling like smoke.  After 48 hours, damaged nerve endings begin to recover. Your sense of taste and smell improve.  After 72 hours, the body is virtually free of nicotine. Your bronchial tubes relax and breathing becomes easier.  After 2 to 12 weeks, lungs can hold more air. Exercise becomes easier and circulation improves.  The risk of having a heart attack, stroke, cancer, or lung disease is greatly reduced.  After 1 year, the risk of coronary heart disease is cut in half.  After 5 years, the risk of stroke falls to the same as a nonsmoker.  After 10 years, the risk of lung cancer is cut in half and the risk of other cancers decreases significantly.  After 15 years, the risk of coronary heart disease drops, usually to the level of a nonsmoker.  If you are pregnant, quitting smoking will improve your chances of having a healthy baby.  The people you live with, especially any children, will be healthier.  You will have extra money to spend on things other than cigarettes. QUESTIONS TO THINK ABOUT BEFORE ATTEMPTING TO QUIT You may want to talk about your answers with your  health care provider.  Why do you want to quit?  If you tried to quit in the past, what helped and what did not?  What will be the most difficult situations for you after you quit? How will you plan to handle them?  Who can help you through the tough times? Your family? Friends? A health care provider?  What pleasures do you get from smoking? What ways can you still get pleasure if you quit? Here are some questions to ask your health care provider:  How can you help me to be successful at quitting?  What medicine do you think would be best for me and how should I take it?  What should I do if I need more help?  What is smoking withdrawal like? How can I get information on withdrawal? GET READY  Set a quit date.  Change your environment by getting rid of all cigarettes, ashtrays, matches, and lighters in your home, car, or work. Do not let people smoke in your home.  Review your past attempts to quit. Think about what worked and what did not. GET SUPPORT AND ENCOURAGEMENT You have a better chance of being successful if you have help. You can get support in many ways.  Tell your family, friends, and coworkers that you are going to quit and need their support. Ask them not to smoke around you.  Get individual, group, or telephone counseling and support. Programs are available at local hospitals and health centers. Call   your local health department for information about programs in your area.  Spiritual beliefs and practices may help some smokers quit.  Download a "quit meter" on your computer to keep track of quit statistics, such as how long you have gone without smoking, cigarettes not smoked, and money saved.  Get a self-help book about quitting smoking and staying off tobacco. LEARN NEW SKILLS AND BEHAVIORS  Distract yourself from urges to smoke. Talk to someone, go for a walk, or occupy your time with a task.  Change your normal routine. Take a different route to work.  Drink tea instead of coffee. Eat breakfast in a different place.  Reduce your stress. Take a hot bath, exercise, or read a book.  Plan something enjoyable to do every day. Reward yourself for not smoking.  Explore interactive web-based programs that specialize in helping you quit. GET MEDICINE AND USE IT CORRECTLY Medicines can help you stop smoking and decrease the urge to smoke. Combining medicine with the above behavioral methods and support can greatly increase your chances of successfully quitting smoking.  Nicotine replacement therapy helps deliver nicotine to your body without the negative effects and risks of smoking. Nicotine replacement therapy includes nicotine gum, lozenges, inhalers, nasal sprays, and skin patches. Some may be available over-the-counter and others require a prescription.  Antidepressant medicine helps people abstain from smoking, but how this works is unknown. This medicine is available by prescription.  Nicotinic receptor partial agonist medicine simulates the effect of nicotine in your brain. This medicine is available by prescription. Ask your health care provider for advice about which medicines to use and how to use them based on your health history. Your health care provider will tell you what side effects to look out for if you choose to be on a medicine or therapy. Carefully read the information on the package. Do not use any other product containing nicotine while using a nicotine replacement product.  RELAPSE OR DIFFICULT SITUATIONS Most relapses occur within the first 3 months after quitting. Do not be discouraged if you start smoking again. Remember, most people try several times before finally quitting. You may have symptoms of withdrawal because your body is used to nicotine. You may crave cigarettes, be irritable, feel very hungry, cough often, get headaches, or have difficulty concentrating. The withdrawal symptoms are only temporary. They are strongest  when you first quit, but they will go away within 10-14 days. To reduce the chances of relapse, try to:  Avoid drinking alcohol. Drinking lowers your chances of successfully quitting.  Reduce the amount of caffeine you consume. Once you quit smoking, the amount of caffeine in your body increases and can give you symptoms, such as a rapid heartbeat, sweating, and anxiety.  Avoid smokers because they can make you want to smoke.  Do not let weight gain distract you. Many smokers will gain weight when they quit, usually less than 10 pounds. Eat a healthy diet and stay active. You can always lose the weight gained after you quit.  Find ways to improve your mood other than smoking. FOR MORE INFORMATION  www.smokefree.gov  Document Released: 01/30/2001 Document Revised: 06/22/2013 Document Reviewed: 05/17/2011 [employer] Patient Information 2015 [employer]. This information is not intended to replace advice given to you by your health care provider. Make sure you discuss any questions you have with your health care provider.  

## 2014-01-26 ENCOUNTER — Telehealth: Payer: Self-pay | Admitting: Family Medicine

## 2014-01-26 NOTE — Telephone Encounter (Signed)
emmi emailed °

## 2014-02-03 ENCOUNTER — Inpatient Hospital Stay: Admission: RE | Admit: 2014-02-03 | Payer: Self-pay | Source: Ambulatory Visit

## 2014-02-22 ENCOUNTER — Ambulatory Visit (INDEPENDENT_AMBULATORY_CARE_PROVIDER_SITE_OTHER)
Admission: RE | Admit: 2014-02-22 | Discharge: 2014-02-22 | Disposition: A | Discharge status: Home / Self Care | Payer: Self-pay | Source: Ambulatory Visit | Attending: Family Medicine | Admitting: Family Medicine

## 2014-02-22 DIAGNOSIS — Z129 Encounter for screening for malignant neoplasm, site unspecified: Secondary | ICD-10-CM

## 2014-02-23 ENCOUNTER — Telehealth: Payer: Self-pay | Admitting: Family Medicine

## 2014-02-23 NOTE — Telephone Encounter (Signed)
Patient states he was returning your call and would like a callback.

## 2014-02-24 NOTE — Telephone Encounter (Signed)
Pt informed

## 2014-03-19 ENCOUNTER — Other Ambulatory Visit: Payer: Self-pay | Admitting: Family Medicine

## 2014-03-26 ENCOUNTER — Telehealth: Payer: Self-pay | Admitting: Family Medicine

## 2014-03-26 MED ORDER — AMLODIPINE BESYLATE 5 MG PO TABS: 5.0000 mg | ORAL_TABLET | Freq: Every day | ORAL | Status: DC

## 2014-03-26 MED ORDER — VALSARTAN 160 MG PO TABS: 160.0000 mg | ORAL_TABLET | Freq: Every day | ORAL | Status: DC

## 2014-03-26 NOTE — Telephone Encounter (Signed)
D/C Azor (and take off list) Start Amlodipine 5 mg po qd and valsartan 160 mg po qd-OK to refill for 6 months.

## 2014-03-26 NOTE — Telephone Encounter (Addendum)
Pt can not afford azor 5-40 mg. Pt would like to take two separate medication to make up for azor amlodipine-olmesartan sent to [facility]

## 2014-03-26 NOTE — Telephone Encounter (Signed)
Left a message for pt to return call.  Azor discontinued on [employer] and the 2 new prescriptions were sent to the pharmacy.

## 2014-04-26 ENCOUNTER — Other Ambulatory Visit (INDEPENDENT_AMBULATORY_CARE_PROVIDER_SITE_OTHER): Payer: BLUE CROSS/BLUE SHIELD

## 2014-04-26 DIAGNOSIS — R740 Nonspecific elevation of levels of transaminase and lactic acid dehydrogenase [LDH]: Secondary | ICD-10-CM

## 2014-04-26 DIAGNOSIS — E785 Hyperlipidemia, unspecified: Secondary | ICD-10-CM

## 2014-04-26 DIAGNOSIS — R7401 Elevation of levels of liver transaminase levels: Secondary | ICD-10-CM

## 2014-04-26 LAB — HEPATIC FUNCTION PANEL
ALK PHOS: 54 U/L (ref 39–117)
ALT: 28 U/L (ref 0–53)
AST: 19 U/L (ref 0–37)
Albumin: 4.4 g/dL (ref 3.5–5.2)
Bilirubin, Direct: 0.1 mg/dL (ref 0.0–0.3)
Total Bilirubin: 0.5 mg/dL (ref 0.2–1.2)
Total Protein: 7.2 g/dL (ref 6.0–8.3)

## 2014-04-26 LAB — LIPID PANEL
CHOL/HDL RATIO: 4
CHOLESTEROL: 184 mg/dL (ref 0–200)
HDL: 42.5 mg/dL (ref >39.00)
LDL Cholesterol: 114 mg/dL — ABNORMAL HIGH (ref 0–99)
NonHDL: 141.5
TRIGLYCERIDES: 137 mg/dL (ref 0.0–149.0)
VLDL: 27.4 mg/dL (ref 0.0–40.0)

## 2014-05-31 ENCOUNTER — Telehealth: Payer: Self-pay | Admitting: Family Medicine

## 2014-05-31 MED ORDER — METHYLPHENIDATE HCL ER (LA) 20 MG PO CP24: 20.0000 mg | ORAL_CAPSULE | ORAL | Status: DC

## 2014-05-31 NOTE — Telephone Encounter (Signed)
Refills OK. 

## 2014-05-31 NOTE — Telephone Encounter (Signed)
Pt aware that Rx is ready for pick up 

## 2014-05-31 NOTE — Telephone Encounter (Signed)
Pt needs new rx ritalin

## 2014-05-31 NOTE — Telephone Encounter (Signed)
Last visit 01/25/14 Last refill 01/25/14 #30 2 refill

## 2014-06-10 ENCOUNTER — Ambulatory Visit (INDEPENDENT_AMBULATORY_CARE_PROVIDER_SITE_OTHER): Payer: BLUE CROSS/BLUE SHIELD | Admitting: Family Medicine

## 2014-06-10 ENCOUNTER — Encounter: Payer: Self-pay | Admitting: Family Medicine

## 2014-06-10 VITALS — BP 132/80 | HR 72 | Temp 98.6°F | Wt 212.0 lb

## 2014-06-10 DIAGNOSIS — M79672 Pain in left foot: Secondary | ICD-10-CM

## 2014-06-10 MED ORDER — PREDNISONE 20 MG PO TABS: ORAL_TABLET | ORAL | Status: DC

## 2014-06-10 MED ORDER — VALSARTAN 160 MG PO TABS: 160.0000 mg | ORAL_TABLET | Freq: Every day | ORAL | Status: DC

## 2014-06-10 MED ORDER — AMLODIPINE BESYLATE 5 MG PO TABS: 5.0000 mg | ORAL_TABLET | Freq: Every day | ORAL | Status: DC

## 2014-06-10 NOTE — Progress Notes (Signed)
   Subjective:    Patient ID: John Best, male    DOB: [birth year], 57 y.o.   MRN: [id number]  HPI Left foot pain. Duration about one and one half months. No injury. No recent change of shoe wear. Location is near the ventral surface of the foot or the first and second MTP joints. Possibly some mild swelling. No warmth. No history of gout. Pain is worse with ambulation. No numbness. No radiculopathy symptoms. Pain is improved with naproxen. He has not tried any icing.  No history of similar problem.  Past Medical History  Diagnosis Date  . Hypertension   . Arthritis   . Depression   . Headache(784.0)   . Hyperlipidemia    Past Surgical History  Procedure Laterality Date  . Other surgical history      bilateral thumb fusions   . Thumb fusion  2005  . Shoulder surgery  2013    reports that he has been smoking Cigarettes.  He has a 30 pack-year smoking history. He has never used smokeless tobacco. He reports that he drinks alcohol. He reports that he does not use illicit drugs. family history includes Diabetes in his brother; Heart disease (age of onset: 28) in his father; Hyperlipidemia in his father and mother; Hypertension in his father. No Known Allergies    Review of Systems  Constitutional: Negative for fever and chills.  Skin: Negative for rash.  Neurological: Negative for weakness and numbness.       Objective:   Physical Exam  Constitutional: He appears well-developed and well-nourished. No distress.  Cardiovascular: Normal rate and regular rhythm.   Pulmonary/Chest: Effort normal and breath sounds normal.  Musculoskeletal:  Left foot reveals no obvious erythema. Foot is warm to touch. Good distal pulses. No rash. Full range of motion ankle. No localized bony tenderness. Minimally tender first and second metatarsal head. No calluses. No increased warmth compared to right foot. No interdigital tenderness.          Assessment & Plan:  Left foot pain. Etiology  not clear. No evidence for Morton's neuroma. Doubt stress fracture. Location would be somewhat unusual for gout. Doubt neuropathic.  Check uric acid and sedimentation rate. Obtain x-ray. Prednisone 20 mg 2 daily for 5 days. Consider podiatry referral if symptoms persist

## 2014-06-10 NOTE — Progress Notes (Signed)
Pre visit review using our clinic review tool, if applicable. No additional management support is needed unless otherwise documented below in the visit note. 

## 2014-06-11 ENCOUNTER — Ambulatory Visit (INDEPENDENT_AMBULATORY_CARE_PROVIDER_SITE_OTHER)
Admission: RE | Admit: 2014-06-11 | Discharge: 2014-06-11 | Disposition: A | Discharge status: Home / Self Care | Payer: BLUE CROSS/BLUE SHIELD | Source: Ambulatory Visit | Attending: Family Medicine | Admitting: Family Medicine

## 2014-06-11 DIAGNOSIS — M79672 Pain in left foot: Secondary | ICD-10-CM | POA: Diagnosis not present

## 2014-06-11 LAB — URIC ACID: URIC ACID, SERUM: 5.6 mg/dL (ref 4.0–7.8)

## 2014-06-11 LAB — SEDIMENTATION RATE: Sed Rate: 9 mm/hr (ref 0–22)

## 2014-06-14 ENCOUNTER — Other Ambulatory Visit: Payer: Self-pay | Admitting: Family Medicine

## 2014-06-14 DIAGNOSIS — M79672 Pain in left foot: Secondary | ICD-10-CM

## 2014-06-21 ENCOUNTER — Ambulatory Visit (INDEPENDENT_AMBULATORY_CARE_PROVIDER_SITE_OTHER): Payer: BLUE CROSS/BLUE SHIELD | Admitting: Podiatry

## 2014-06-21 ENCOUNTER — Ambulatory Visit: Payer: Self-pay

## 2014-06-21 ENCOUNTER — Encounter: Payer: Self-pay | Admitting: Podiatry

## 2014-06-21 VITALS — BP 128/80 | HR 81 | Resp 15

## 2014-06-21 DIAGNOSIS — M722 Plantar fascial fibromatosis: Secondary | ICD-10-CM

## 2014-06-21 DIAGNOSIS — M779 Enthesopathy, unspecified: Secondary | ICD-10-CM

## 2014-06-21 MED ORDER — TRIAMCINOLONE ACETONIDE 10 MG/ML IJ SUSP
10.0000 mg | Freq: Once | INTRAMUSCULAR | Status: AC
  Administered 2014-06-21: 10 mg

## 2014-06-21 NOTE — Progress Notes (Signed)
   Subjective:    Patient ID: John Best, male    DOB: Aug 24, 1955, 59 y.o.   MRN: [id number]  HPI Pt presents with pain in 2nd toe at plantar aspect, ongoing and worsening   Review of Systems  All other systems reviewed and are negative.      Objective:   Physical Exam        Assessment & Plan:

## 2014-06-22 NOTE — Progress Notes (Signed)
Subjective:     Patient ID: John Best, male   DOB: [birth year], 59 y.o.   MRN: [id number]  HPI patient states I'm having a lot of pain in my left foot and I'm due to go to [place] in 3 weeks. States it's bad when he walks on it and that he's very active   Review of Systems  All other systems reviewed and are negative.      Objective:   Physical Exam  Constitutional: He is oriented to person, place, and time.  Cardiovascular: Intact distal pulses.   Musculoskeletal: Normal range of motion.  Neurological: He is oriented to person, place, and time.  Skin: Skin is warm.  Nursing note and vitals reviewed.  neurovascular status intact with muscle strength adequate range of motion within normal limits and patient noted to have very painful second metatarsal phalangeal joint left. Patient states that it has been present for a while and worsened over the last couple months and patient is noted to have good digital perfusion and is well oriented 3     Assessment:     Inflammatory capsulitis second MPJ left foot    Plan:     H&P and x-rays reviewed with patient. Today I did a proximal nerve block aspirated the joint getting out a small amount of clear fluid and injected with a quarter cc deck some Kenalog first explaining risk of rupture to the patient. Applied padding reappoint 2 weeks and we'll consider long-term orthotics and other treatment depending on how he responds

## 2014-07-05 ENCOUNTER — Ambulatory Visit (INDEPENDENT_AMBULATORY_CARE_PROVIDER_SITE_OTHER): Payer: BLUE CROSS/BLUE SHIELD | Admitting: Podiatry

## 2014-07-05 ENCOUNTER — Encounter: Payer: Self-pay | Admitting: Podiatry

## 2014-07-05 VITALS — BP 111/50 | HR 76 | Resp 18

## 2014-07-05 DIAGNOSIS — M722 Plantar fascial fibromatosis: Secondary | ICD-10-CM | POA: Diagnosis not present

## 2014-07-05 DIAGNOSIS — M779 Enthesopathy, unspecified: Secondary | ICD-10-CM

## 2014-07-05 NOTE — Progress Notes (Signed)
Subjective:     Patient ID: John Best, male   DOB: 1955-08-20, 59 y.o.   MRN: [id number]  HPI patient states my foot is doing quite a bit better but still has discomfort of a been on a long time and I know I'm getting the long-term support   Review of Systems     Objective:   Physical Exam Neuro vascular status intact with diminished discomfort second MPJ left with fluid buildup still noted but about 70-80% improvement    Assessment:     Inflammatory capsulitis which is improving left with probable inflammation still noted of the joint surface    Plan:     Reviewed condition and scanned for custom orthotics to reduce all pressure against the joint surface. Instructed on rigid bottom shoes and dispensed more pads to wear on his trip and reappoint for orthotic dispensing in 3 weeks

## 2014-08-04 ENCOUNTER — Ambulatory Visit: Payer: BLUE CROSS/BLUE SHIELD | Admitting: *Deleted

## 2014-08-04 DIAGNOSIS — M722 Plantar fascial fibromatosis: Secondary | ICD-10-CM

## 2014-08-04 NOTE — Patient Instructions (Signed)

## 2014-08-04 NOTE — Progress Notes (Signed)
Patient ID: John Best, male   DOB: [birth year], 59 y.o.   MRN: [id number]  PICKING UP INSERTS

## 2014-09-22 ENCOUNTER — Telehealth: Payer: Self-pay | Admitting: Family Medicine

## 2014-09-22 NOTE — Telephone Encounter (Signed)
Last visit 06/10/14 Last refill 05/31/14 #30 2 refill

## 2014-09-22 NOTE — Telephone Encounter (Signed)
Refill OK

## 2014-09-22 NOTE — Telephone Encounter (Signed)
Pt needs new rx ritalin 20 mg.

## 2014-09-23 MED ORDER — METHYLPHENIDATE HCL ER (LA) 20 MG PO CP24: 20.0000 mg | ORAL_CAPSULE | ORAL | Status: DC

## 2014-09-23 NOTE — Telephone Encounter (Signed)
Pt aware that Rx is ready for pick up 

## 2014-12-29 ENCOUNTER — Ambulatory Visit (INDEPENDENT_AMBULATORY_CARE_PROVIDER_SITE_OTHER): Payer: BLUE CROSS/BLUE SHIELD | Admitting: Family Medicine

## 2014-12-29 ENCOUNTER — Other Ambulatory Visit: Payer: Self-pay

## 2014-12-29 DIAGNOSIS — Z23 Encounter for immunization: Secondary | ICD-10-CM

## 2014-12-29 MED ORDER — METHYLPHENIDATE HCL ER (LA) 20 MG PO CP24: 20.0000 mg | ORAL_CAPSULE | ORAL | Status: DC

## 2014-12-29 NOTE — Telephone Encounter (Signed)
Pt was in office today for flu shot, he was due for med. Signed and given to patient.

## 2014-12-29 NOTE — Progress Notes (Signed)
   Subjective:    Patient ID: John Best, male    DOB: 1955/07/10, 59 y.o.   MRN: [id number]  HPI    Review of Systems     Objective:   Physical Exam        Assessment & Plan:

## 2015-03-15 ENCOUNTER — Other Ambulatory Visit: Payer: Self-pay | Admitting: Family Medicine

## 2015-03-15 NOTE — Telephone Encounter (Signed)
Pt needs new rx ritalin la 20 mg

## 2015-03-16 NOTE — Telephone Encounter (Signed)
Last seen on 06/10/2014 acute visit Last refill was 12/29/2014 x3 Not due until 03/31/15

## 2015-03-17 NOTE — Telephone Encounter (Signed)
Did he lose one of the refills?  Please clarify.

## 2015-03-17 NOTE — Telephone Encounter (Signed)
Please advise if pt can have 3 refills or 1?

## 2015-03-18 NOTE — Telephone Encounter (Signed)
Spoke with patient and refill not due until 03/31/2015.

## 2015-03-31 MED ORDER — METHYLPHENIDATE HCL ER (LA) 20 MG PO CP24: 20.0000 mg | ORAL_CAPSULE | ORAL | Status: DC

## 2015-03-31 NOTE — Telephone Encounter (Signed)
Pt is aware that scripts are up front. 

## 2015-03-31 NOTE — Telephone Encounter (Signed)
Printed for signature

## 2015-05-23 ENCOUNTER — Other Ambulatory Visit: Payer: Self-pay | Admitting: Family Medicine

## 2015-05-23 ENCOUNTER — Other Ambulatory Visit (INDEPENDENT_AMBULATORY_CARE_PROVIDER_SITE_OTHER): Payer: BLUE CROSS/BLUE SHIELD

## 2015-05-23 DIAGNOSIS — E785 Hyperlipidemia, unspecified: Secondary | ICD-10-CM

## 2015-05-23 DIAGNOSIS — I10 Essential (primary) hypertension: Secondary | ICD-10-CM

## 2015-05-23 LAB — CBC WITH DIFFERENTIAL/PLATELET
BASOS ABS: 0 10*3/uL (ref 0.0–0.1)
Basophils Relative: 0.3 % (ref 0.0–3.0)
EOS ABS: 0.3 10*3/uL (ref 0.0–0.7)
Eosinophils Relative: 3.6 % (ref 0.0–5.0)
HCT: 50.4 % (ref 39.0–52.0)
HEMOGLOBIN: 17.1 g/dL — AB (ref 13.0–17.0)
LYMPHS ABS: 2.4 10*3/uL (ref 0.7–4.0)
Lymphocytes Relative: 33.9 % (ref 12.0–46.0)
MCHC: 34 g/dL (ref 30.0–36.0)
MCV: 86.2 fl (ref 78.0–100.0)
MONO ABS: 0.9 10*3/uL (ref 0.1–1.0)
Monocytes Relative: 12.3 % — ABNORMAL HIGH (ref 3.0–12.0)
NEUTROS PCT: 49.9 % (ref 43.0–77.0)
Neutro Abs: 3.6 10*3/uL (ref 1.4–7.7)
Platelets: 285 10*3/uL (ref 150.0–400.0)
RBC: 5.84 Mil/uL — AB (ref 4.22–5.81)
RDW: 13.8 % (ref 11.5–15.5)
WBC: 7.2 10*3/uL (ref 4.0–10.5)

## 2015-05-23 LAB — LDL CHOLESTEROL, DIRECT: LDL DIRECT: 118 mg/dL

## 2015-05-23 LAB — BASIC METABOLIC PANEL
BUN: 19 mg/dL (ref 6–23)
CALCIUM: 9.5 mg/dL (ref 8.4–10.5)
CO2: 30 meq/L (ref 19–32)
CREATININE: 0.95 mg/dL (ref 0.40–1.50)
Chloride: 106 mEq/L (ref 96–112)
GFR: 86.57 mL/min (ref >60.00)
GLUCOSE: 110 mg/dL — AB (ref 70–99)
Potassium: 5.7 mEq/L — ABNORMAL HIGH (ref 3.5–5.1)
Sodium: 142 mEq/L (ref 135–145)

## 2015-05-23 LAB — LIPID PANEL
Cholesterol: 228 mg/dL — ABNORMAL HIGH (ref 0–200)
HDL: 38.1 mg/dL — AB (ref >39.00)
NONHDL: 190.12
Total CHOL/HDL Ratio: 6
Triglycerides: 308 mg/dL — ABNORMAL HIGH (ref 0.0–149.0)
VLDL: 61.6 mg/dL — ABNORMAL HIGH (ref 0.0–40.0)

## 2015-05-23 LAB — HEPATIC FUNCTION PANEL
ALK PHOS: 59 U/L (ref 39–117)
ALT: 38 U/L (ref 0–53)
AST: 18 U/L (ref 0–37)
Albumin: 4.3 g/dL (ref 3.5–5.2)
BILIRUBIN DIRECT: 0.1 mg/dL (ref 0.0–0.3)
BILIRUBIN TOTAL: 0.3 mg/dL (ref 0.2–1.2)
Total Protein: 6.8 g/dL (ref 6.0–8.3)

## 2015-05-23 LAB — TSH: TSH: 3.29 u[IU]/mL (ref 0.35–4.50)

## 2015-05-23 LAB — PSA: PSA: 0.74 ng/mL (ref 0.10–4.00)

## 2015-05-30 ENCOUNTER — Ambulatory Visit (INDEPENDENT_AMBULATORY_CARE_PROVIDER_SITE_OTHER): Payer: BLUE CROSS/BLUE SHIELD | Admitting: Family Medicine

## 2015-05-30 ENCOUNTER — Encounter: Payer: Self-pay | Admitting: Family Medicine

## 2015-05-30 VITALS — BP 120/70 | HR 106 | Temp 98.8°F | Ht 66.0 in | Wt 208.9 lb

## 2015-05-30 DIAGNOSIS — F17200 Nicotine dependence, unspecified, uncomplicated: Secondary | ICD-10-CM | POA: Diagnosis not present

## 2015-05-30 DIAGNOSIS — Z0000 Encounter for general adult medical examination without abnormal findings: Secondary | ICD-10-CM | POA: Diagnosis not present

## 2015-05-30 DIAGNOSIS — E785 Hyperlipidemia, unspecified: Secondary | ICD-10-CM | POA: Diagnosis not present

## 2015-05-30 DIAGNOSIS — R739 Hyperglycemia, unspecified: Secondary | ICD-10-CM | POA: Diagnosis not present

## 2015-05-30 NOTE — Progress Notes (Signed)
Subjective:    Patient ID: John Best, male    DOB: 1955-03-06, 60 y.o.   MRN: [id number]  HPI Patient seen for complete physical. He has history of obesity, hypertension, dyslipidemia, attention deficit disorder. Ongoing nicotine use. Patient had colonoscopy about 8 years ago and states he was told to get five-year follow-up. He plans to re-schedule. Job is physical.   No consistent exercise.   Patient had low-dose CT scanning of lung last year which was unremarkable.  Hypertension treated with Valsartan  and amlodipine.   Family history as below. He also states he has a brother with type 1 diabetes. No family history of type 2 diabetes.  Past Medical History  Diagnosis Date  . Hypertension   . Arthritis   . Depression   . Headache(784.0)   . Hyperlipidemia    Past Surgical History  Procedure Laterality Date  . Other surgical history      bilateral thumb fusions   . Thumb fusion  2005  . Shoulder surgery  2013    reports that he has been smoking Cigarettes.  He has a 30 pack-year smoking history. He has never used smokeless tobacco. He reports that he drinks alcohol. He reports that he does not use illicit drugs. family history includes Cancer in his paternal grandfather; Diabetes in his brother; Heart disease (age of onset: 58) in his father; Hyperlipidemia in his father and mother; Hypertension in his father. No Known Allergies     Review of Systems  Constitutional: Negative for fever, activity change, appetite change and fatigue.  HENT: Negative for congestion, ear pain and trouble swallowing.   Eyes: Negative for pain and visual disturbance.  Respiratory: Negative for cough, shortness of breath and wheezing.   Cardiovascular: Negative for chest pain and palpitations.  Gastrointestinal: Negative for nausea, vomiting, abdominal pain, diarrhea, constipation, blood in stool, abdominal distention and rectal pain.  Genitourinary: Negative for dysuria, hematuria and  testicular pain.  Musculoskeletal: Negative for joint swelling and arthralgias.  Skin: Negative for rash.  Neurological: Negative for dizziness, syncope and headaches.  Hematological: Negative for adenopathy.  Psychiatric/Behavioral: Negative for confusion and dysphoric mood.       Objective:   Physical Exam  Constitutional: He is oriented to person, place, and time. He appears well-developed and well-nourished. No distress.  HENT:  Head: Normocephalic and atraumatic.  Right Ear: External ear normal.  Left Ear: External ear normal.  Mouth/Throat: Oropharynx is clear and moist.  Eyes: Conjunctivae and EOM are normal. Pupils are equal, round, and reactive to light.  Neck: Normal range of motion. Neck supple. No thyromegaly present.  Cardiovascular: Normal rate, regular rhythm and normal heart sounds.   No murmur heard. Pulmonary/Chest: No respiratory distress. He has no wheezes. He has no rales.  Abdominal: Soft. Bowel sounds are normal. He exhibits no distension and no mass. There is no tenderness. There is no rebound and no guarding.  Musculoskeletal: He exhibits no edema.  Lymphadenopathy:    He has no cervical adenopathy.  Neurological: He is alert and oriented to person, place, and time. He displays normal reflexes. No cranial nerve deficit.  Skin: No rash noted.  Psychiatric: He has a normal mood and affect.          Assessment & Plan:   Physical exam. Labs reviewed. Potassium 5.7 suspect hemolyzed. Takes no potassium supplement whatsoever. Patient will schedule repeat colonoscopy. Labs are significant for glucose 110. He has dyslipidemia and meets criteria for metabolic syndrome with elevated  triglycerides, low HDL, hyperglycemia, and hypertension history. We discussed implications. Handout on diet given as well as metabolic syndrome. He prefers lifestyle management and recheck fasting [id number], basic metabolic panel, and lipid panel in 6 months. He is strongly encouraged to stop  smoking

## 2015-05-30 NOTE — Patient Instructions (Signed)
Food Choices to Lower Your Triglycerides Triglycerides are a type of fat in your blood. High levels of triglycerides can increase the risk of heart disease and stroke. If your triglyceride levels are high, the foods you eat and your eating habits are very important. Choosing the right foods can help lower your triglycerides.  WHAT GENERAL GUIDELINES DO I NEED TO FOLLOW?  Lose weight if you are overweight.   Limit or avoid alcohol.   Fill one half of your plate with vegetables and green salads.   Limit fruit to two servings a day. Choose fruit instead of juice.   Make one fourth of your plate whole grains. Look for the word "whole" as the first word in the ingredient list.  Fill one fourth of your plate with lean protein foods.  Enjoy fatty fish (such as salmon, mackerel, sardines, and tuna) three times a week.   Choose healthy fats.   Limit foods high in starch and sugar.  Eat more home-cooked food and less restaurant, buffet, and fast food.  Limit fried foods.  Cook foods using methods other than frying.  Limit saturated fats.  Check ingredient lists to avoid foods with partially hydrogenated oils (trans fats) in them. WHAT FOODS CAN I EAT?  Grains Whole grains, such as whole wheat or whole grain breads, crackers, cereals, and pasta. Unsweetened oatmeal, bulgur, barley, quinoa, or brown rice. Corn or whole wheat flour tortillas.  Vegetables Fresh or frozen vegetables (raw, steamed, roasted, or grilled). Green salads. Fruits All fresh, canned (in natural juice), or frozen fruits. Meat and Other Protein Products Ground beef (85% or leaner), grass-fed beef, or beef trimmed of fat. Skinless chicken or turkey. Ground chicken or turkey. Pork trimmed of fat. All fish and seafood. Eggs. Dried beans, peas, or lentils. Unsalted nuts or seeds. Unsalted canned or dry beans. Dairy Low-fat dairy products, such as skim or 1% milk, 2% or reduced-fat cheeses, low-fat ricotta or cottage  cheese, or plain low-fat yogurt. Fats and Oils Tub margarines without trans fats. Light or reduced-fat mayonnaise and salad dressings. Avocado. Safflower, olive, or canola oils. Natural peanut or almond butter. The items listed above may not be a complete list of recommended foods or beverages. Contact your dietitian for more options. WHAT FOODS ARE NOT RECOMMENDED?  Grains White bread. White pasta. White rice. Cornbread. Bagels, pastries, and croissants. Crackers that contain trans fat. Vegetables White potatoes. Corn. Creamed or fried vegetables. Vegetables in a cheese sauce. Fruits Dried fruits. Canned fruit in light or heavy syrup. Fruit juice. Meat and Other Protein Products Fatty cuts of meat. Ribs, chicken wings, bacon, sausage, bologna, salami, chitterlings, fatback, hot dogs, bratwurst, and packaged luncheon meats. Dairy Whole or 2% milk, cream, half-and-half, and cream cheese. Whole-fat or sweetened yogurt. Full-fat cheeses. Nondairy creamers and whipped toppings. Processed cheese, cheese spreads, or cheese curds. Sweets and Desserts Corn syrup, sugars, honey, and molasses. Candy. Jam and jelly. Syrup. Sweetened cereals. Cookies, pies, cakes, donuts, muffins, and ice cream. Fats and Oils Butter, stick margarine, lard, shortening, ghee, or bacon fat. Coconut, palm kernel, or palm oils. Beverages Alcohol. Sweetened drinks (such as sodas, lemonade, and fruit drinks or punches). The items listed above may not be a complete list of foods and beverages to avoid. Contact your dietitian for more information.   This information is not intended to replace advice given to you by your health care provider. Make sure you discuss any questions you have with your health care provider.   Document Released: 11/24/2003   Document Revised: 02/26/2014 Document Reviewed: 12/10/2012 Elsevier Interactive Patient Education 2016 Elsevier Inc. Metabolic Syndrome Metabolic syndrome is the presence of at  least three factors that increase your risk of getting cardiovascular disease and diabetes. These factors are:  High blood sugar.  High blood triglyceride level.  High blood pressure.  Low levels of good blood cholesterol (high-density lipoprotein or HDL).  Excess weight around the waist. This factor is present with a waist measurement of:  More than 40 inches in men.  More than 35 inches in women. Metabolic syndrome is sometimes called insulin resistance syndrome and syndrome X. CAUSES The exact cause is not known, but genetics and lifestyle choices play a role. RISK FACTORS You are more likely to develop metabolic syndrome if:  You eat a diet high in calories and saturated fat.  You do not exercise regularly.  You are overweight.  You have a family history of metabolic syndrome.  You are Asian.  You are older in age.  You have insulin resistance.  You use any tobacco products, including cigarettes, chewing tobacco, or electronic cigarettes. SIGNS AND SYMPTOMS Metabolic syndrome has no specific symptoms. DIAGNOSIS To make a diagnosis, your health care provider will determine whether you have at least three of the factors that make up metabolic syndrome by:  Taking your blood pressure.  Measuring your waist.  Ordering blood tests. TREATMENT Treatment may include:  Lifestyle changes to reduce your risk for heart disease and stroke, such as:  Exercise.  Weight loss.  Maintaining a healthy diet.  Quitting the use of any tobacco products, including cigarettes, chewing tobacco, or electronic cigarettes.  Medicines that:  Help your body to maintain glucose control.  Reduce your blood pressure and your blood triglyceride levels. HOME CARE INSTRUCTIONS  Exercise regularly.  Maintain a healthy diet.  Do not use any tobacco products, including cigarettes, chewing tobacco, or electronic cigarettes. If you need help quitting, ask your health care  provider.  Keep all follow-up visits as directed by your health care provider. This is important.  Measure your waist regularly and record the measurement. To measure your waist:  Stand up straight.  Breathe out.  Wrap the measuring tape around the part of your waist that is just above your hipbones.  Read the measurement. SEEK MEDICAL CARE IF:  You feel very tired.  You develop excessive thirst.  You pass large quantities of urine.  You put on weight around your waist.  You have headaches over and over again.  You have a dizzy spell. SEEK IMMEDIATE MEDICAL CARE IF:  You develop sudden blurred vision.  You develop a sudden dizzy spell.  You have sudden trouble speaking or swallowing.  You have sudden weakness in your arm or leg.  You have chest pains or trouble breathing.  You feel like your heartbeat is abnormal.  You faint.   This information is not intended to replace advice given to you by your health care provider. Make sure you discuss any questions you have with your health care provider.   Document Released: 05/15/2007 Document Revised: 02/26/2014 Document Reviewed: 09/11/2013 Elsevier Interactive Patient Education 2016 [employer]. Diet for Metabolic Syndrome Metabolic syndrome is a disorder that includes at least three of these conditions:  Abdominal obesity.  Too much sugar in your blood.  High blood pressure.  Higher than normal amount of fat (lipids) in your blood.  Lower than normal level of "good" cholesterol (HDL). Following a healthy diet can help to keep metabolic syndrome under  control. It can also help to prevent the development of conditions that are associated with metabolic syndrome, such as diabetes, heart disease, and stroke. Along with exercise, a healthy diet:  Helps to improve the way that the body uses insulin.  Promotes weight loss. A common goal for people with this condition is to lose at least 7 to 10 percent of their  starting weight. WHAT DO I NEED TO KNOW ABOUT THIS DIET?  Use the glycemic index (GI) to plan your meals. The index tells you how quickly a food will raise your blood sugar. Choose foods that have low GI values. These foods take a longer time to raise blood sugar.  Keep track of how many calories you take in. Eating the right amount of calories will help your achieve a healthy weight.  You may want to follow a Mediterranean diet. This diet includes lots of vegetables, lean meats or fish, whole grains, fruits, and healthy oils and fats. WHAT FOODS CAN I EAT? Grains Stone-ground whole wheat. Pumpernickel bread. Whole-grain bread, crackers, tortillas, cereal, and pasta. Unsweetened oatmeal.Bulgur.Barley.Quinoa.Brown rice or wild rice. Vegetables Lettuce. Spinach. Peas. Beets. Cauliflower. Cabbage. Broccoli. Carrots. Tomatoes. Squash. Eggplant. Herbs. Peppers. Onions. Cucumbers. Brussels sprouts. Sweet potatoes. Yams. Beans. Lentils. Fruits Berries. Apples. Oranges. Grapes. Mango. Pomegranate. Kiwi. Cherries. Meats and Other Protein Sources Seafood and shellfish. Lean meats.Poultry. Tofu. Dairy Low-fat or fat-free dairy products, such as milk, yogurt, and cheese. Beverages Water. Low-fat milk. Milk alternatives, like soy milk or almond milk. Real fruit juice. Condiments Low-sugar or sugar-free ketchup, barbecue sauce, and mayonnaise. Mustard. Relish. Fats and Oils Avocado. Canola or olive oil. Nuts and nut butters.Seeds. The items listed above may not be a complete list of recommended foods or beverages. Contact your dietitian for more options.  WHAT FOODS ARE NOT RECOMMENDED? Red meat. Palm oil and coconut oil. Processed foods. Fried foods. Alcohol. Sweetened drinks, such as iced tea and soda. Sweets. Salty foods. The items listed above may not be a complete list of foods and beverages to avoid. Contact your dietitian for more information.   This information is not intended to  replace advice given to you by your health care provider. Make sure you discuss any questions you have with your health care provider.   Document Released: 06/22/2014 Document Reviewed: 06/22/2014 Elsevier Interactive Patient Education [employer].  Schedule repeat colonoscopy

## 2015-05-30 NOTE — Progress Notes (Signed)
Pre visit review using our clinic review tool, if applicable. No additional management support is needed unless otherwise documented below in the visit note. 

## 2015-06-14 ENCOUNTER — Other Ambulatory Visit: Payer: Self-pay | Admitting: Family Medicine

## 2015-06-29 ENCOUNTER — Telehealth: Payer: Self-pay | Admitting: Family Medicine

## 2015-06-29 NOTE — Telephone Encounter (Signed)
Last OV 05/30/15 Last refill 03/31/2015 #30 x3 Please advise

## 2015-06-29 NOTE — Telephone Encounter (Signed)
° ° ° °

## 2015-06-29 NOTE — Telephone Encounter (Signed)
Refill OK

## 2015-06-30 MED ORDER — METHYLPHENIDATE HCL ER (LA) 20 MG PO CP24: 20.0000 mg | ORAL_CAPSULE | ORAL | Status: DC

## 2015-06-30 NOTE — Telephone Encounter (Signed)
Printed for Dr. Elease Hashimoto to sign Friday.

## 2015-07-01 NOTE — Telephone Encounter (Signed)
Pt is aware that RX is up front for pick up. 

## 2015-10-26 ENCOUNTER — Telehealth: Payer: Self-pay | Admitting: Family Medicine

## 2015-10-26 NOTE — Telephone Encounter (Signed)
Refill OK

## 2015-10-26 NOTE — Telephone Encounter (Signed)
Pt request refill methylphenidate (RITALIN LA) 20 MG 24 hr capsule  3 mo supply

## 2015-10-26 NOTE — Telephone Encounter (Addendum)
Last OV 05/30/1015 Pending appt 06/04/2016 Last refill 06/30/2015 x3 Please advise

## 2015-10-27 MED ORDER — METHYLPHENIDATE HCL ER (LA) 20 MG PO CP24: 20.0000 mg | ORAL_CAPSULE | ORAL | 0 refills | Status: DC

## 2015-10-27 NOTE — Telephone Encounter (Signed)
Pt is aware that scripts will be signed tomorrow and placed up front.

## 2015-11-28 ENCOUNTER — Other Ambulatory Visit (INDEPENDENT_AMBULATORY_CARE_PROVIDER_SITE_OTHER): Payer: BLUE CROSS/BLUE SHIELD

## 2015-11-28 DIAGNOSIS — E785 Hyperlipidemia, unspecified: Secondary | ICD-10-CM

## 2015-11-28 DIAGNOSIS — R739 Hyperglycemia, unspecified: Secondary | ICD-10-CM | POA: Diagnosis not present

## 2015-11-28 LAB — LIPID PANEL
Cholesterol: 194 mg/dL (ref 0–200)
HDL: 43.2 mg/dL (ref >39.00)
NONHDL: 150.59
Total CHOL/HDL Ratio: 4
Triglycerides: 203 mg/dL — ABNORMAL HIGH (ref 0.0–149.0)
VLDL: 40.6 mg/dL — ABNORMAL HIGH (ref 0.0–40.0)

## 2015-11-28 LAB — BASIC METABOLIC PANEL
BUN: 13 mg/dL (ref 6–23)
CALCIUM: 9.4 mg/dL (ref 8.4–10.5)
CO2: 30 meq/L (ref 19–32)
CREATININE: 0.91 mg/dL (ref 0.40–1.50)
Chloride: 105 mEq/L (ref 96–112)
GFR: 90.81 mL/min (ref >60.00)
GLUCOSE: 106 mg/dL — AB (ref 70–99)
Potassium: 4.9 mEq/L (ref 3.5–5.1)
Sodium: 140 mEq/L (ref 135–145)

## 2015-11-28 LAB — HEMOGLOBIN A1C: Hgb A1c MFr Bld: 5.9 % (ref 4.6–6.5)

## 2015-11-28 LAB — LDL CHOLESTEROL, DIRECT: LDL DIRECT: 129 mg/dL

## 2016-01-05 ENCOUNTER — Other Ambulatory Visit: Payer: Self-pay | Admitting: Family Medicine

## 2016-02-02 ENCOUNTER — Telehealth: Payer: Self-pay | Admitting: Family Medicine

## 2016-02-02 NOTE — Telephone Encounter (Signed)
Pt need new Rx for Ritalin   Pt is aware of 3 business day for refills.

## 2016-02-02 NOTE — Telephone Encounter (Signed)
Last OV 08-19-2015 Last refill 10/28/2015 #30 x3 Please advise

## 2016-02-03 MED ORDER — METHYLPHENIDATE HCL ER (LA) 20 MG PO CP24
20.0000 mg | ORAL_CAPSULE | ORAL | 0 refills | Status: DC
Start: 2016-02-03 — End: 2016-05-23

## 2016-02-03 MED ORDER — METHYLPHENIDATE HCL ER (LA) 20 MG PO CP24: 20.0000 mg | ORAL_CAPSULE | ORAL | 0 refills | Status: DC

## 2016-02-03 NOTE — Telephone Encounter (Signed)
Printed for signature

## 2016-02-03 NOTE — Telephone Encounter (Signed)
Pt is aware that scripts are up front.

## 2016-02-03 NOTE — Telephone Encounter (Signed)
Refills OK. 

## 2016-02-06 ENCOUNTER — Ambulatory Visit (INDEPENDENT_AMBULATORY_CARE_PROVIDER_SITE_OTHER): Payer: BLUE CROSS/BLUE SHIELD

## 2016-02-06 DIAGNOSIS — Z23 Encounter for immunization: Secondary | ICD-10-CM | POA: Diagnosis not present

## 2016-03-22 ENCOUNTER — Telehealth: Payer: Self-pay

## 2016-03-22 ENCOUNTER — Other Ambulatory Visit: Payer: Self-pay

## 2016-03-22 NOTE — Telephone Encounter (Signed)
See open note.

## 2016-03-22 NOTE — Telephone Encounter (Signed)
Received PA request from [facility] for Methylphenidate. PA submitted & is pending. Key:  [id number]

## 2016-03-22 NOTE — Telephone Encounter (Signed)
Request from [facility] ask for a PA on Methylphenidate 20mg  1qdam Can you start this authorization? Thanks.

## 2016-03-23 NOTE — Telephone Encounter (Signed)
PA approved, form faxed back to pharmacy. 

## 2016-05-21 ENCOUNTER — Telehealth: Payer: Self-pay | Admitting: Family Medicine

## 2016-05-21 DIAGNOSIS — Z125 Encounter for screening for malignant neoplasm of prostate: Secondary | ICD-10-CM

## 2016-05-21 DIAGNOSIS — E785 Hyperlipidemia, unspecified: Secondary | ICD-10-CM

## 2016-05-21 DIAGNOSIS — I10 Essential (primary) hypertension: Secondary | ICD-10-CM

## 2016-05-21 NOTE — Telephone Encounter (Signed)
Last refill 02/03/16, last office visit 05/30/15, next office visit 06/04/16.  Okay to fill?

## 2016-05-21 NOTE — Telephone Encounter (Signed)
Refill once until follow up. 

## 2016-05-21 NOTE — Telephone Encounter (Signed)
° ° ° °  Pt request refill of the following:  methylphenidate (RITALIN LA) 20 MG 24 hr capsule   Phamacy:

## 2016-05-23 MED ORDER — METHYLPHENIDATE HCL ER (LA) 20 MG PO CP24: 20.0000 mg | ORAL_CAPSULE | ORAL | 0 refills | Status: DC

## 2016-05-23 NOTE — Addendum Note (Signed)
Addended by: Westley Hummer B on: 05/23/2016 09:32 AM   Modules accepted: Orders

## 2016-05-23 NOTE — Telephone Encounter (Signed)
Patient is aware and an appointment scheduled 

## 2016-05-28 ENCOUNTER — Other Ambulatory Visit (INDEPENDENT_AMBULATORY_CARE_PROVIDER_SITE_OTHER): Payer: BLUE CROSS/BLUE SHIELD

## 2016-05-28 DIAGNOSIS — E785 Hyperlipidemia, unspecified: Secondary | ICD-10-CM | POA: Diagnosis not present

## 2016-05-28 DIAGNOSIS — I10 Essential (primary) hypertension: Secondary | ICD-10-CM | POA: Diagnosis not present

## 2016-05-28 DIAGNOSIS — Z125 Encounter for screening for malignant neoplasm of prostate: Secondary | ICD-10-CM

## 2016-05-28 LAB — CBC WITH DIFFERENTIAL/PLATELET
Basophils Absolute: 0 10*3/uL (ref 0.0–0.1)
Basophils Relative: 0.4 % (ref 0.0–3.0)
EOS PCT: 4.2 % (ref 0.0–5.0)
Eosinophils Absolute: 0.3 10*3/uL (ref 0.0–0.7)
HEMATOCRIT: 51.2 % (ref 39.0–52.0)
Hemoglobin: 17.4 g/dL — ABNORMAL HIGH (ref 13.0–17.0)
LYMPHS ABS: 1.9 10*3/uL (ref 0.7–4.0)
Lymphocytes Relative: 26.7 % (ref 12.0–46.0)
MCHC: 34 g/dL (ref 30.0–36.0)
MCV: 87.9 fl (ref 78.0–100.0)
MONOS PCT: 12.1 % — AB (ref 3.0–12.0)
Monocytes Absolute: 0.9 10*3/uL (ref 0.1–1.0)
NEUTROS PCT: 56.6 % (ref 43.0–77.0)
Neutro Abs: 4 10*3/uL (ref 1.4–7.7)
PLATELETS: 238 10*3/uL (ref 150.0–400.0)
RBC: 5.82 Mil/uL — AB (ref 4.22–5.81)
RDW: 14 % (ref 11.5–15.5)
WBC: 7.1 10*3/uL (ref 4.0–10.5)

## 2016-05-28 LAB — HEPATIC FUNCTION PANEL
ALBUMIN: 4.2 g/dL (ref 3.5–5.2)
ALK PHOS: 53 U/L (ref 39–117)
ALT: 39 U/L (ref 0–53)
AST: 20 U/L (ref 0–37)
BILIRUBIN DIRECT: 0.1 mg/dL (ref 0.0–0.3)
Total Bilirubin: 0.3 mg/dL (ref 0.2–1.2)
Total Protein: 6.5 g/dL (ref 6.0–8.3)

## 2016-05-28 LAB — LIPID PANEL
CHOLESTEROL: 187 mg/dL (ref 0–200)
HDL: 40.5 mg/dL (ref >39.00)
LDL Cholesterol: 109 mg/dL — ABNORMAL HIGH (ref 0–99)
NONHDL: 146.22
Total CHOL/HDL Ratio: 5
Triglycerides: 185 mg/dL — ABNORMAL HIGH (ref 0.0–149.0)
VLDL: 37 mg/dL (ref 0.0–40.0)

## 2016-05-28 LAB — BASIC METABOLIC PANEL
BUN: 18 mg/dL (ref 6–23)
CO2: 30 mEq/L (ref 19–32)
Calcium: 9.4 mg/dL (ref 8.4–10.5)
Chloride: 105 mEq/L (ref 96–112)
Creatinine, Ser: 0.87 mg/dL (ref 0.40–1.50)
GFR: 95.48 mL/min (ref >60.00)
Glucose, Bld: 108 mg/dL — ABNORMAL HIGH (ref 70–99)
Potassium: 4.8 mEq/L (ref 3.5–5.1)
SODIUM: 141 meq/L (ref 135–145)

## 2016-05-28 LAB — TSH: TSH: 3.03 u[IU]/mL (ref 0.35–4.50)

## 2016-05-28 LAB — PSA: PSA: 0.62 ng/mL (ref 0.10–4.00)

## 2016-06-04 ENCOUNTER — Encounter: Payer: Self-pay | Admitting: Family Medicine

## 2016-06-04 ENCOUNTER — Ambulatory Visit (INDEPENDENT_AMBULATORY_CARE_PROVIDER_SITE_OTHER): Payer: BLUE CROSS/BLUE SHIELD | Admitting: Family Medicine

## 2016-06-04 VITALS — BP 150/90 | HR 75 | Temp 98.1°F | Ht 67.0 in | Wt 213.3 lb

## 2016-06-04 DIAGNOSIS — R7303 Prediabetes: Secondary | ICD-10-CM

## 2016-06-04 DIAGNOSIS — Z0000 Encounter for general adult medical examination without abnormal findings: Secondary | ICD-10-CM | POA: Diagnosis not present

## 2016-06-04 MED ORDER — BUPROPION HCL ER (SR) 150 MG PO TB12: 150.0000 mg | ORAL_TABLET | Freq: Two times a day (BID) | ORAL | 5 refills | Status: DC

## 2016-06-04 NOTE — Progress Notes (Signed)
Pre visit review using our clinic review tool, if applicable. No additional management support is needed unless otherwise documented below in the visit note. 

## 2016-06-04 NOTE — Progress Notes (Signed)
Subjective:     Patient ID: John Best, male   DOB: 1955-04-11, 61 y.o.   MRN: [id number]  HPI Patient seen for complete physical. He still smokes about half pack of cigarettes per day. Over 30 pack year history. He had low-dose lung CT scan couple years ago but skipped last year. He would like to consider getting this repeated this year. He's taken Wellbutrin in the past for smoking cessation and requesting refills. He plans to retire in May. Currently drinks 3-4 ounces of whiskey per day. He has hypertension treated with amlodipine and valsartan and. Will be due for repeat colonoscopy by next year.  Past Medical History:  Diagnosis Date  . Arthritis   . Depression   . Headache(784.0)   . Hyperlipidemia   . Hypertension    Past Surgical History:  Procedure Laterality Date  . OTHER SURGICAL HISTORY     bilateral thumb fusions   . SHOULDER SURGERY  2013  . THUMB FUSION  2005    reports that he has been smoking Cigarettes.  He has a 30.00 pack-year smoking history. He has never used smokeless tobacco. He reports that he drinks alcohol. He reports that he does not use drugs. family history includes Cancer in his paternal grandfather; Diabetes in his brother; Heart disease (age of onset: 6) in his father; Hyperlipidemia in his father and mother; Hypertension in his father. No Known Allergies   Review of Systems  Constitutional: Negative for activity change, appetite change, fatigue and fever.  HENT: Negative for congestion, ear pain and trouble swallowing.   Eyes: Negative for pain and visual disturbance.  Respiratory: Negative for cough, shortness of breath and wheezing.   Cardiovascular: Negative for chest pain and palpitations.  Gastrointestinal: Negative for abdominal distention, abdominal pain, blood in stool, constipation, diarrhea, nausea, rectal pain and vomiting.  Genitourinary: Negative for dysuria, hematuria and testicular pain.  Musculoskeletal: Negative for arthralgias  and joint swelling.  Skin: Negative for rash.  Neurological: Negative for dizziness, syncope and headaches.  Hematological: Negative for adenopathy.  Psychiatric/Behavioral: Negative for confusion and dysphoric mood.       Objective:   Physical Exam  Constitutional: He is oriented to person, place, and time. He appears well-developed and well-nourished. No distress.  HENT:  Head: Normocephalic and atraumatic.  Right Ear: External ear normal.  Left Ear: External ear normal.  Mouth/Throat: Oropharynx is clear and moist.  Eyes: Conjunctivae and EOM are normal. Pupils are equal, round, and reactive to light.  Neck: Normal range of motion. Neck supple. No thyromegaly present.  Cardiovascular: Normal rate, regular rhythm and normal heart sounds.   No murmur heard. Pulmonary/Chest: No respiratory distress. He has no wheezes. He has no rales.  Abdominal: Soft. Bowel sounds are normal. He exhibits no distension and no mass. There is no tenderness. There is no rebound and no guarding.  Musculoskeletal: He exhibits no edema.  Lymphadenopathy:    He has no cervical adenopathy.  Neurological: He is alert and oriented to person, place, and time. He displays normal reflexes. No cranial nerve deficit.  Skin: No rash noted.  Psychiatric: He has a normal mood and affect.       Assessment:     Physical exam. Labs reviewed. He has history of prediabetes. His blood pressures not well controlled on exam today. Repeat left arm seated after rest 150/90. He has ongoing nicotine use and we discussed importance of reduction in alcohol use    Plan:     -He  is encouraged to lose some weight -Smoking cessation discussed -Reduce alcohol intake and explained relevance of this to hypertension -Monitor blood pressure closely and be in touch if consistently greater than 140/90 over the next few weeks -Set up low-dose lung CT scanning for cancer screening -Reduce sugar and starch intake -Consider A1c at  follow-up  Eulas Post MD [facility]

## 2016-06-04 NOTE — Patient Instructions (Signed)
Monitor blood pressure and be in touch if consistently > 140/90.   

## 2016-06-13 ENCOUNTER — Other Ambulatory Visit: Payer: Self-pay | Admitting: Acute Care

## 2016-06-13 DIAGNOSIS — F17210 Nicotine dependence, cigarettes, uncomplicated: Principal | ICD-10-CM

## 2016-06-18 ENCOUNTER — Ambulatory Visit (INDEPENDENT_AMBULATORY_CARE_PROVIDER_SITE_OTHER)
Admission: RE | Admit: 2016-06-18 | Discharge: 2016-06-18 | Disposition: A | Discharge status: Home / Self Care | Payer: BLUE CROSS/BLUE SHIELD | Source: Ambulatory Visit | Attending: Acute Care | Admitting: Acute Care

## 2016-06-18 ENCOUNTER — Ambulatory Visit (INDEPENDENT_AMBULATORY_CARE_PROVIDER_SITE_OTHER): Payer: BLUE CROSS/BLUE SHIELD | Admitting: Acute Care

## 2016-06-18 ENCOUNTER — Encounter: Payer: Self-pay | Admitting: Acute Care

## 2016-06-18 DIAGNOSIS — Z87891 Personal history of nicotine dependence: Secondary | ICD-10-CM

## 2016-06-18 DIAGNOSIS — F17210 Nicotine dependence, cigarettes, uncomplicated: Principal | ICD-10-CM

## 2016-06-18 NOTE — Progress Notes (Signed)
Shared Decision Making Visit Lung Cancer Screening Program [phone])   Eligibility:  Age 61 y.o.  Pack Years Smoking History Calculation 44-pack-year smoking history (# packs/per year x # years smoked)  Recent History of coughing up blood  no  Unexplained weight loss? no ( >Than 15 pounds within the last 6 months )  Prior History Lung / other cancer no (Diagnosis within the last 5 years already requiring surveillance chest CT Scans).  Smoking Status Current Smoker  Former Smokers: Years since quit: na  Quit Date: na  Visit Components:  Discussion included one or more decision making aids. yes  Discussion included risk/benefits of screening. yes  Discussion included potential follow up diagnostic testing for abnormal scans. yes  Discussion included meaning and risk of over diagnosis. yes  Discussion included meaning and risk of False Positives. yes  Discussion included meaning of total radiation exposure. yes  Counseling Included:  Importance of adherence to annual lung cancer LDCT screening. yes  Impact of comorbidities on ability to participate in the program. yes  Ability and willingness to under diagnostic treatment. yes  Smoking Cessation Counseling:  Current Smokers:   Discussed importance of smoking cessation. yes  Information about tobacco cessation classes and interventions provided to patient. yes  Patient provided with "ticket" for LDCT Scan. yes  Symptomatic Patient. no  Counseling  Diagnosis Code: Tobacco Use Z72.0  Asymptomatic Patient yes  Counseling (Intermediate counseling: > three minutes counseling) L8937  Former Smokers:   Discussed the importance of maintaining cigarette abstinence. yes  Diagnosis Code: Personal History of Nicotine Dependence. D42.876  Information about tobacco cessation classes and interventions provided to patient. Yes  Patient provided with "ticket" for LDCT Scan. yes  Written Order for Lung Cancer  Screening with LDCT placed in Epic. Yes (CT Chest Lung Cancer Screening Low Dose W/O CM) [id number] Z12.2-Screening of respiratory organs Z87.891-Personal history of nicotine dependence  I have spent 25 minutes of face to face time with Mr. and Mrs. Trumpower discussing the risks and benefits of lung cancer screening. We viewed a power point together that explained in detail the above noted topics. We paused at intervals to allow for questions to be asked and answered to ensure understanding.We discussed that the single most powerful action that he can take to decrease his risk of developing lung cancer is to quit smoking. We discussed whether or not he is ready to commit to setting a quit date. He is currently not ready to set a quit date. We discussed options for tools to aid in quitting smoking including nicotine replacement therapy, non-nicotine medications, support groups, Quit Smart classes, and behavior modification. We discussed that often times setting smaller, more achievable goals, such as eliminating 1 cigarette a day for a week and then 2 cigarettes a day for a week can be helpful in slowly decreasing the number of cigarettes smoked. This allows for a sense of accomplishment as well as providing a clinical benefit. I gave Mr. Guzy the " Be Stronger Than Your Excuses" card with contact information for community resources, classes, free nicotine replacement therapy, and access to mobile apps, text messaging, and on-line smoking cessation help. I have also given him my card and contact information in the event he needs to contact me. We discussed the time and location of the scan, and that either Doroteo Glassman RN or I will call with the results within 24-48 hours of receiving them. I have offered Mr. Farruggia  a copy of the power point we  viewed  as a resource in the event they need reinforcement of the concepts we discussed today in the office. The patient verbalized understanding of all of  the  above and had no further questions upon leaving the office. They have my contact information in the event they have any further questions.  I spent for minutes counseling on smoking cessation and the health risks of continued tobacco abuse.  I explained to the patient that there has been a high incidence of coronary artery disease noted on these exams. I explained that this is a non-gated exam therefore degree or severity cannot be determined. This patient is not currently on statin therapy. I have asked the patient to follow-up with their PCP regarding any incidental finding of coronary artery disease and management with diet or medication as their PCP  feels is clinically indicated. The patient verbalized understanding of the above and had no further questions upon completion of the visit.     Magdalen Spatz, NP 06/18/2016

## 2016-06-22 ENCOUNTER — Telehealth: Payer: Self-pay | Admitting: Acute Care

## 2016-06-25 ENCOUNTER — Other Ambulatory Visit: Payer: Self-pay | Admitting: Acute Care

## 2016-06-25 DIAGNOSIS — F17210 Nicotine dependence, cigarettes, uncomplicated: Principal | ICD-10-CM

## 2016-06-25 NOTE — Telephone Encounter (Signed)
Pt informed of CT results per [name], NP.  PT verbalized understanding.  Copy sent to PCP.  Order placed for 1 yr f/u CT.  

## 2016-06-25 NOTE — Telephone Encounter (Signed)
LMTC x 1  

## 2016-07-04 ENCOUNTER — Encounter: Payer: Self-pay | Admitting: Family Medicine

## 2016-07-06 ENCOUNTER — Telehealth: Payer: Self-pay | Admitting: Family Medicine

## 2016-07-06 MED ORDER — METHYLPHENIDATE HCL ER (LA) 20 MG PO CP24: 20.0000 mg | ORAL_CAPSULE | ORAL | 0 refills | Status: DC

## 2016-07-06 NOTE — Telephone Encounter (Signed)
Last rx given on 4/4 for #30 with no ref

## 2016-07-06 NOTE — Telephone Encounter (Signed)
Refill OK

## 2016-07-06 NOTE — Telephone Encounter (Signed)
Pt needs new rx generic ritalin 20 mg

## 2016-07-06 NOTE — Telephone Encounter (Signed)
I called the pt and informed him the Rx was left at the front desk for pick up.

## 2016-07-09 LAB — HM COLONOSCOPY

## 2016-07-18 ENCOUNTER — Telehealth: Payer: Self-pay | Admitting: Family Medicine

## 2016-07-18 MED ORDER — VALSARTAN 160 MG PO TABS: 160.0000 mg | ORAL_TABLET | Freq: Every day | ORAL | 3 refills | Status: DC

## 2016-07-18 MED ORDER — BUPROPION HCL ER (SR) 150 MG PO TB12: 150.0000 mg | ORAL_TABLET | Freq: Two times a day (BID) | ORAL | 3 refills | Status: DC

## 2016-07-18 MED ORDER — AMLODIPINE BESYLATE 5 MG PO TABS: 5.0000 mg | ORAL_TABLET | Freq: Every day | ORAL | 3 refills | Status: DC

## 2016-07-18 NOTE — Telephone Encounter (Signed)
OK to refill all non-controlled for one year.

## 2016-07-18 NOTE — Telephone Encounter (Signed)
Can not override PCP recs and I do not provide 90 days of controlled meds. Not sure if PCP would either. Can let him know date PCP returns. Thanks.

## 2016-07-18 NOTE — Telephone Encounter (Signed)
Rxs sent for the three Rxs below and I called the pt and informed him of this and that Dr Elease Hashimoto stated he did not approve a refill on Ritalin as this was just filled on 5/18.  Patient stated he will be out of town and needs a 90 day supply on the Ritalin, will be out of Ritalin and stated he mentioned this at his last visit and I informed him a message will be sent to another provider to review as Dr Elease Hashimoto is out of the office. Message sent to Dr Maudie Mercury to address on Thursday.

## 2016-07-18 NOTE — Telephone Encounter (Signed)
° °  Pt is changing pharmacies and need new RX was sent to the below pharmacy, pt said all his RX should be 90 day supply  amLODipine (NORVASC) 5 MG tablet  buPROPion (WELLBUTRIN SR) 150 MG 12 hr tablet  valsartan (DIOVAN) 160 MG tablet  methylphenidate (RITALIN LA) 20 MG 24 hr capsule   [facility]

## 2016-07-18 NOTE — Telephone Encounter (Signed)
Last rx given for Ritalin on 5/18 for #30 with no ref

## 2016-07-19 ENCOUNTER — Encounter: Payer: Self-pay | Admitting: Family Medicine

## 2016-07-19 NOTE — Telephone Encounter (Signed)
Patient is aware that Dr Elease Hashimoto will return 07/30/16.  Patient will have someone pick up his prescription, as he will be out of town.  Patient is aware that the person should bring ID.

## 2016-07-27 NOTE — Telephone Encounter (Signed)
Okay to fill for 3 months? 

## 2016-07-30 MED ORDER — METHYLPHENIDATE HCL ER (LA) 20 MG PO CP24: 20.0000 mg | ORAL_CAPSULE | ORAL | 0 refills | Status: DC

## 2016-07-30 NOTE — Addendum Note (Signed)
Addended by: Westley Hummer B on: 07/30/2016 08:11 AM   Modules accepted: Orders

## 2016-07-30 NOTE — Telephone Encounter (Signed)
OK 

## 2016-07-30 NOTE — Telephone Encounter (Signed)
Left message on machine for patient Rx ready for pick up 

## 2016-11-08 ENCOUNTER — Encounter: Payer: Self-pay | Admitting: Family Medicine

## 2016-11-12 ENCOUNTER — Telehealth: Payer: Self-pay | Admitting: Family Medicine

## 2016-11-12 NOTE — Telephone Encounter (Signed)
° ° °  Pt request refill of the following:  methylphenidate (RITALIN LA) 20 MG 24 hr capsule   Pt also said his bp has been running a little high and would like a call back    Phamacy:

## 2016-11-12 NOTE — Telephone Encounter (Signed)
Last refill 07/30/16 and last office visit 06/04/16.  Okay to fill?

## 2016-11-12 NOTE — Telephone Encounter (Signed)
Refill once and recommend office follow up if BP running high.

## 2016-11-13 MED ORDER — METHYLPHENIDATE HCL ER (LA) 20 MG PO CP24: 20.0000 mg | ORAL_CAPSULE | ORAL | 0 refills | Status: DC

## 2016-11-13 NOTE — Telephone Encounter (Signed)
Rx ready for pick up and an appointment made.

## 2016-11-16 ENCOUNTER — Encounter: Payer: Self-pay | Admitting: Family Medicine

## 2016-11-16 ENCOUNTER — Ambulatory Visit (INDEPENDENT_AMBULATORY_CARE_PROVIDER_SITE_OTHER): Payer: BLUE CROSS/BLUE SHIELD | Admitting: Family Medicine

## 2016-11-16 VITALS — BP 140/80 | HR 84 | Temp 98.8°F | Wt 214.9 lb

## 2016-11-16 DIAGNOSIS — I10 Essential (primary) hypertension: Secondary | ICD-10-CM

## 2016-11-16 DIAGNOSIS — Z23 Encounter for immunization: Secondary | ICD-10-CM | POA: Diagnosis not present

## 2016-11-16 DIAGNOSIS — F988 Other specified behavioral and emotional disorders with onset usually occurring in childhood and adolescence: Secondary | ICD-10-CM

## 2016-11-16 MED ORDER — METHYLPHENIDATE HCL ER (LA) 20 MG PO CP24: 20.0000 mg | ORAL_CAPSULE | ORAL | 0 refills | Status: DC

## 2016-11-16 MED ORDER — VALSARTAN-HYDROCHLOROTHIAZIDE 160-12.5 MG PO TABS: 1.0000 | ORAL_TABLET | Freq: Every day | ORAL | 3 refills | Status: DC

## 2016-11-16 NOTE — Progress Notes (Signed)
Subjective:     Patient ID: John Best, male   DOB: [birth year], 61 y.o.   MRN: [id number]  HPI  Patient here follow-up hypertension. He currently takes amlodipine 5 mg daily and valsartan 160 mg daily. He went to dentist recently and had blood pressure 161/92. He denies any headaches. No chest pains.  He had right ankle injury a few months ago and this has limited his activity somewhat. He has follow-up with orthopedist for that. He has scaled back his alcohol intake to less than 3 ounces of whiskey per day.  Patient had previously been on Diovan HCTZ but had some lightheadedness and dizziness and was converted to plain valsartan and over year ago.  Has ADD on Ritalin SR.    Needs refills. No recent chest pains.  No insomnia.  Past Medical History:  Diagnosis Date  . Arthritis   . Depression   . Headache(784.0)   . Hyperlipidemia   . Hypertension    Past Surgical History:  Procedure Laterality Date  . OTHER SURGICAL HISTORY     bilateral thumb fusions   . SHOULDER SURGERY  2013  . THUMB FUSION  2005    reports that he has been smoking Cigarettes.  He has a 30.00 pack-year smoking history. He has never used smokeless tobacco. He reports that he drinks alcohol. He reports that he does not use drugs. family history includes Cancer in his paternal grandfather; Diabetes in his brother; Heart disease (age of onset: 9) in his father; Hyperlipidemia in his father and mother; Hypertension in his father. No Known Allergies  Review of Systems  Constitutional: Negative for fatigue.  Eyes: Negative for visual disturbance.  Respiratory: Negative for cough, chest tightness and shortness of breath.   Cardiovascular: Negative for chest pain, palpitations and leg swelling.  Neurological: Negative for dizziness, syncope, weakness, light-headedness and headaches.       Objective:   Physical Exam  Constitutional: He is oriented to person, place, and time. He appears well-developed and  well-nourished.  HENT:  Right Ear: External ear normal.  Left Ear: External ear normal.  Mouth/Throat: Oropharynx is clear and moist.  Eyes: Pupils are equal, round, and reactive to light.  Neck: Neck supple. No thyromegaly present.  Cardiovascular: Normal rate and regular rhythm.   Pulmonary/Chest: Effort normal and breath sounds normal. No respiratory distress. He has no wheezes. He has no rales.  Musculoskeletal: He exhibits no edema.  Neurological: He is alert and oriented to person, place, and time.       Assessment:     #1 Hypertension. Poorly controlled.  #2 Adult attention deficit disorder    Plan:     -Refill Ritalin for 3 months -Continue amlodipine 5 mg daily and change Diovan to Diovan HCTZ 160/12.5 mg 1 daily -Keep sodium intake less than 2500 mg daily -Office follow-up to reassess blood pressure within 3 months -Flu vaccine given -regular aerobic exercise recommended.  John Post MD [facility]

## 2016-11-19 ENCOUNTER — Encounter (INDEPENDENT_AMBULATORY_CARE_PROVIDER_SITE_OTHER): Payer: Self-pay | Admitting: Orthopedic Surgery

## 2016-11-19 ENCOUNTER — Ambulatory Visit (INDEPENDENT_AMBULATORY_CARE_PROVIDER_SITE_OTHER): Payer: BLUE CROSS/BLUE SHIELD | Admitting: Orthopedic Surgery

## 2016-11-19 DIAGNOSIS — M25871 Other specified joint disorders, right ankle and foot: Secondary | ICD-10-CM

## 2016-11-19 MED ORDER — LIDOCAINE HCL 1 % IJ SOLN
2.0000 mL | INTRAMUSCULAR | Status: AC | PRN
  Administered 2016-11-19: 2 mL

## 2016-11-19 MED ORDER — METHYLPREDNISOLONE ACETATE 40 MG/ML IJ SUSP
40.0000 mg | INTRAMUSCULAR | Status: AC | PRN
  Administered 2016-11-19: 40 mg via INTRA_ARTICULAR

## 2016-11-19 NOTE — Progress Notes (Signed)
Office Visit Note   Patient: John Best           Date of Birth: 14-Oct-1955           MRN: [id number] Visit Date: 11/19/2016              Requested by: Eulas Post, MD [place] , [place] [address] PCP: Eulas Post, MD  Chief Complaint  Patient presents with  . Right Ankle - Pain      HPI: Patient is a 61 year old gentleman who is about 3 months status post right ankle sprain. Initial radiographs showed no fractures. Patient states his ankle is painful while walking pain over the anterior medial and anterolateral joint line. Patient has used ice elevation and Naprosyn.  Assessment & Plan: Visit Diagnoses:  1. Impingement of right ankle joint     Plan: The right ankle was injected from the anterior medial portal he tolerated this well recommended increasing his activities as tolerated. Follow-up in 4 weeks if he is still symptomatic for evaluation for possible ankle arthroscopy. Discussed that the injection should relieve his symptoms.  Follow-Up Instructions: Return if symptoms worsen or fail to improve.   Ortho Exam  Patient is alert, oriented, no adenopathy, well-dressed, normal affect, normal respiratory effort. Examination patient is a good dorsalis pedis pulse he has no tenderness to palpation along the posterior tibial tendon. No tendons or Achilles tendon. He has good dorsiflexion the ankle with his knee extended. Anterior drawer is stable without laxity. He is tender to palpation over the anterior medial and anterior lateral joint line.  Imaging: No results found. No images are attached to the encounter.  Labs: Lab Results  Component Value Date   HGBA1C 5.9 11/28/2015   ESRSEDRATE 9 06/10/2014   LABURIC 5.6 06/10/2014   REPTSTATUS 02/09/2011 FINAL 02/06/2011   CULT NO STAPHYLOCOCCUS AUREUS ISOLATED 02/06/2011    Orders:  No orders of the defined types were placed in this encounter.  No orders of the defined types were  placed in this encounter.    Procedures: Medium Joint Inj Date/Time: 11/19/2016 4:14 PM Performed by: Ronetta Molla V Authorized by: Newt Minion   Consent Given by:  Patient Site marked: the procedure site was marked   Timeout: prior to procedure the correct patient, procedure, and site was verified   Indications:  Pain and diagnostic evaluation Location:  Ankle Site:  R ankle Prep: patient was prepped and draped in usual sterile fashion   Needle Size:  22 G Needle Length:  1.5 inches Approach:  Anteromedial Ultrasound Guided: No   Fluoroscopic Guidance: No   Medications:  2 mL lidocaine 1 %; 40 mg methylPREDNISolone acetate 40 MG/ML Aspiration Attempted: No   Patient tolerance:  Patient tolerated the procedure well with no immediate complications    Clinical Data: No additional findings.  ROS:  All other systems negative, except as noted in the HPI. Review of Systems  Objective: Vital Signs: There were no vitals taken for this visit.  Specialty Comments:  No specialty comments available.  PMFS History: Patient Active Problem List   Diagnosis Date Noted  . Prediabetes 06/04/2016  . Nicotine use disorder 05/30/2015  . Obesity (BMI 30-39.9) 12/08/2012  . ADD (attention deficit disorder) 05/16/2011  . Hypertension 05/16/2011  . Hyperlipidemia 05/16/2011  . Migraine 05/16/2011   Past Medical History:  Diagnosis Date  . Arthritis   . Depression   . Diabetes mellitus without complication ([place])   .  Headache(784.0)   . Hyperlipidemia   . Hypertension     Family History  Problem Relation Age of Onset  . Hyperlipidemia Mother   . Heart disease Father 70       CAD  . Hyperlipidemia Father   . Hypertension Father   . Diabetes Brother        type ll  . Cancer Paternal Grandfather        colon cancer    Past Surgical History:  Procedure Laterality Date  . OTHER SURGICAL HISTORY     bilateral thumb fusions   . SHOULDER SURGERY  2013  . THUMB FUSION   2005   Social History   Occupational History  . Not on file.   Social History Main Topics  . Smoking status: Current Every Day Smoker    Packs/day: 1.00    Years: 30.00    Types: Cigarettes  . Smokeless tobacco: Never Used  . Alcohol use Yes     Comment: 14 drinks/wk drinks combination of all   . Drug use: No  . Sexual activity: Not on file

## 2017-02-14 ENCOUNTER — Telehealth: Payer: Self-pay | Admitting: Family Medicine

## 2017-02-14 NOTE — Telephone Encounter (Signed)
A script for Ritalin dated 11/13/2016 was discarded today.

## 2017-04-01 ENCOUNTER — Other Ambulatory Visit: Payer: Self-pay | Admitting: Family Medicine

## 2017-04-01 NOTE — Telephone Encounter (Signed)
Copied from [place] [id number]. Topic: Quick Communication - Rx Refill/Question >> Apr 01, 2017  5:25 PM Oliver Pila B wrote: Medication:  methylphenidate (RITALIN LA) 20 MG 24 hr capsule [[id number]]   Pt wants refill

## 2017-04-02 MED ORDER — METHYLPHENIDATE HCL ER (LA) 20 MG PO CP24: 20.0000 mg | ORAL_CAPSULE | ORAL | 0 refills | Status: DC

## 2017-04-02 NOTE — Telephone Encounter (Signed)
Refills OK. 

## 2017-04-02 NOTE — Telephone Encounter (Signed)
Last refill was given at office visit 11/16/16.  Okay to fill?

## 2017-04-08 ENCOUNTER — Encounter: Payer: Self-pay | Admitting: Family Medicine

## 2017-04-09 ENCOUNTER — Telehealth: Payer: Self-pay | Admitting: Family Medicine

## 2017-04-09 NOTE — Telephone Encounter (Signed)
Copied from [place] [id number]. Topic: Quick Communication - See Telephone Encounter >> Apr 09, 2017  1:40 PM Hewitt Shorts wrote: CRM for notification. See Telephone encounter for:  Pt called to check on status of his prior authorization of his request for methylphenidate best number [phone] -per the patient email to mychart -the ofice is needing to know name of the meds he was asking for the prior authorization for    04/09/17.

## 2017-04-10 ENCOUNTER — Telehealth: Payer: Self-pay | Admitting: Family Medicine

## 2017-04-10 NOTE — Telephone Encounter (Signed)
Copied from [place]. Topic: General - Other >> Apr 10, 2017  4:03 PM Cecelia Byars, NT wrote: Reason for CRM: Patient would like a call from Dr Elease Hashimoto at [phone] and he is also calling regarding a  prior authorization for  methylphenidate (RITALIN LA) 20 MG 24 hr capsule  , sent to [facility], [address] [phone] (Phone) [phone] (Fax)

## 2017-04-10 NOTE — Telephone Encounter (Signed)
Spoke with patient and prior auth started.

## 2017-04-11 NOTE — Telephone Encounter (Signed)
See prior note

## 2017-04-11 NOTE — Telephone Encounter (Signed)
Fax received from [facility] stating the Rx is covered through 04/10/2018 and I called [facility] and left a detailed message on the voicemail with this information.

## 2017-06-05 ENCOUNTER — Ambulatory Visit (INDEPENDENT_AMBULATORY_CARE_PROVIDER_SITE_OTHER): Payer: Managed Care, Other (non HMO) | Admitting: Family Medicine

## 2017-06-05 ENCOUNTER — Encounter: Payer: Self-pay | Admitting: Family Medicine

## 2017-06-05 VITALS — BP 120/78 | HR 76 | Temp 98.6°F | Ht 67.0 in | Wt 207.6 lb

## 2017-06-05 DIAGNOSIS — Z0000 Encounter for general adult medical examination without abnormal findings: Secondary | ICD-10-CM

## 2017-06-05 LAB — BASIC METABOLIC PANEL
BUN: 17 mg/dL (ref 6–23)
CO2: 30 mEq/L (ref 19–32)
CREATININE: 0.86 mg/dL (ref 0.40–1.50)
Calcium: 9.1 mg/dL (ref 8.4–10.5)
Chloride: 100 mEq/L (ref 96–112)
GFR: 96.43 mL/min (ref >60.00)
Glucose, Bld: 100 mg/dL — ABNORMAL HIGH (ref 70–99)
Potassium: 4.3 mEq/L (ref 3.5–5.1)
SODIUM: 137 meq/L (ref 135–145)

## 2017-06-05 LAB — CBC WITH DIFFERENTIAL/PLATELET
BASOS PCT: 0.4 % (ref 0.0–3.0)
Basophils Absolute: 0 10*3/uL (ref 0.0–0.1)
EOS ABS: 0.2 10*3/uL (ref 0.0–0.7)
Eosinophils Relative: 4.2 % (ref 0.0–5.0)
HEMATOCRIT: 50.2 % (ref 39.0–52.0)
Hemoglobin: 17.2 g/dL — ABNORMAL HIGH (ref 13.0–17.0)
LYMPHS PCT: 30.3 % (ref 12.0–46.0)
Lymphs Abs: 1.7 10*3/uL (ref 0.7–4.0)
MCHC: 34.3 g/dL (ref 30.0–36.0)
MCV: 88.8 fl (ref 78.0–100.0)
MONOS PCT: 12.6 % — AB (ref 3.0–12.0)
Monocytes Absolute: 0.7 10*3/uL (ref 0.1–1.0)
NEUTROS ABS: 3 10*3/uL (ref 1.4–7.7)
Neutrophils Relative %: 52.5 % (ref 43.0–77.0)
PLATELETS: 263 10*3/uL (ref 150.0–400.0)
RBC: 5.65 Mil/uL (ref 4.22–5.81)
RDW: 13.8 % (ref 11.5–15.5)
WBC: 5.8 10*3/uL (ref 4.0–10.5)

## 2017-06-05 LAB — HEPATIC FUNCTION PANEL
ALT: 24 U/L (ref 0–53)
AST: 15 U/L (ref 0–37)
Albumin: 4.1 g/dL (ref 3.5–5.2)
Alkaline Phosphatase: 55 U/L (ref 39–117)
Bilirubin, Direct: 0.1 mg/dL (ref 0.0–0.3)
TOTAL PROTEIN: 6.6 g/dL (ref 6.0–8.3)
Total Bilirubin: 0.4 mg/dL (ref 0.2–1.2)

## 2017-06-05 LAB — LIPID PANEL
CHOLESTEROL: 177 mg/dL (ref 0–200)
HDL: 40.7 mg/dL (ref >39.00)
LDL CALC: 108 mg/dL — AB (ref 0–99)
NonHDL: 135.86
TRIGLYCERIDES: 139 mg/dL (ref 0.0–149.0)
Total CHOL/HDL Ratio: 4
VLDL: 27.8 mg/dL (ref 0.0–40.0)

## 2017-06-05 LAB — TSH: TSH: 2.52 u[IU]/mL (ref 0.35–4.50)

## 2017-06-05 LAB — PSA: PSA: 0.73 ng/mL (ref 0.10–4.00)

## 2017-06-05 LAB — HEMOGLOBIN A1C: HEMOGLOBIN A1C: 6 % (ref 4.6–6.5)

## 2017-06-05 NOTE — Patient Instructions (Signed)
Steps to Quit Smoking Smoking tobacco can be harmful to your health and can affect almost every organ in your body. Smoking puts you, and those around you, at risk for developing many serious chronic diseases. Quitting smoking is difficult, but it is one of the best things that you can do for your health. It is never too late to quit. What are the benefits of quitting smoking? When you quit smoking, you lower your risk of developing serious diseases and conditions, such as:  Lung cancer or lung disease, such as COPD.  Heart disease.  Stroke.  Heart attack.  Infertility.  Osteoporosis and bone fractures.  Additionally, symptoms such as coughing, wheezing, and shortness of breath may get better when you quit. You may also find that you get sick less often because your body is stronger at fighting off colds and infections. If you are pregnant, quitting smoking can help to reduce your chances of having a baby of low birth weight. How do I get ready to quit? When you decide to quit smoking, create a plan to make sure that you are successful. Before you quit:  Pick a date to quit. Set a date within the next two weeks to give you time to prepare.  Write down the reasons why you are quitting. Keep this list in places where you will see it often, such as on your bathroom mirror or in your car or wallet.  Identify the people, places, things, and activities that make you want to smoke (triggers) and avoid them. Make sure to take these actions: ? Throw away all cigarettes at home, at work, and in your car. ? Throw away smoking accessories, such as ashtrays and lighters. ? Clean your car and make sure to empty the ashtray. ? Clean your home, including curtains and carpets.  Tell your family, friends, and coworkers that you are quitting. Support from your loved ones can make quitting easier.  Talk with your health care provider about your options for quitting smoking.  Find out what treatment  options are covered by your health insurance.  What strategies can I use to quit smoking? Talk with your healthcare provider about different strategies to quit smoking. Some strategies include:  Quitting smoking altogether instead of gradually lessening how much you smoke over a period of time. Research shows that quitting "cold turkey" is more successful than gradually quitting.  Attending in-person counseling to help you build problem-solving skills. You are more likely to have success in quitting if you attend several counseling sessions. Even short sessions of 10 minutes can be effective.  Finding resources and support systems that can help you to quit smoking and remain smoke-free after you quit. These resources are most helpful when you use them often. They can include: ? Online chats with a counselor. ? Telephone quitlines. ? Printed self-help materials. ? Support groups or group counseling. ? Text messaging programs. ? Mobile phone applications.  Taking medicines to help you quit smoking. (If you are pregnant or breastfeeding, talk with your health care provider first.) Some medicines contain nicotine and some do not. Both types of medicines help with cravings, but the medicines that include nicotine help to relieve withdrawal symptoms. Your health care provider may recommend: ? Nicotine patches, gum, or lozenges. ? Nicotine inhalers or sprays. ? Non-nicotine medicine that is taken by mouth.  Talk with your health care provider about combining strategies, such as taking medicines while you are also receiving in-person counseling. Using these two strategies together   makes you more likely to succeed in quitting than if you used either strategy on its own. If you are pregnant or breastfeeding, talk with your health care provider about finding counseling or other support strategies to quit smoking. Do not take medicine to help you quit smoking unless told to do so by your health care  provider. What things can I do to make it easier to quit? Quitting smoking might feel overwhelming at first, but there is a lot that you can do to make it easier. Take these important actions:  Reach out to your family and friends and ask that they support and encourage you during this time. Call telephone quitlines, reach out to support groups, or work with a counselor for support.  Ask people who smoke to avoid smoking around you.  Avoid places that trigger you to smoke, such as bars, parties, or smoke-break areas at work.  Spend time around people who do not smoke.  Lessen stress in your life, because stress can be a smoking trigger for some people. To lessen stress, try: ? Exercising regularly. ? Deep-breathing exercises. ? Yoga. ? Meditating. ? Performing a body scan. This involves closing your eyes, scanning your body from head to toe, and noticing which parts of your body are particularly tense. Purposefully relax the muscles in those areas.  Download or purchase mobile phone or tablet apps (applications) that can help you stick to your quit plan by providing reminders, tips, and encouragement. There are many free apps, such as QuitGuide from the CDC (Centers for Disease Control and Prevention). You can find other support for quitting smoking (smoking cessation) through smokefree.gov and other websites.  How will I feel when I quit smoking? Within the first 24 hours of quitting smoking, you may start to feel some withdrawal symptoms. These symptoms are usually most noticeable 2-3 days after quitting, but they usually do not last beyond 2-3 weeks. Changes or symptoms that you might experience include:  Mood swings.  Restlessness, anxiety, or irritation.  Difficulty concentrating.  Dizziness.  Strong cravings for sugary foods in addition to nicotine.  Mild weight gain.  Constipation.  Nausea.  Coughing or a sore throat.  Changes in how your medicines work in your  body.  A depressed mood.  Difficulty sleeping (insomnia).  After the first 2-3 weeks of quitting, you may start to notice more positive results, such as:  Improved sense of smell and taste.  Decreased coughing and sore throat.  Slower heart rate.  Lower blood pressure.  Clearer skin.  The ability to breathe more easily.  Fewer sick days.  Quitting smoking is very challenging for most people. Do not get discouraged if you are not successful the first time. Some people need to make many attempts to quit before they achieve long-term success. Do your best to stick to your quit plan, and talk with your health care provider if you have any questions or concerns. This information is not intended to replace advice given to you by your health care provider. Make sure you discuss any questions you have with your health care provider. Document Released: 01/30/2001 Document Revised: 10/04/2015 Document Reviewed: 06/22/2014 Elsevier Interactive Patient Education  2018 Elsevier Inc.  

## 2017-06-05 NOTE — Progress Notes (Signed)
Subjective:     Patient ID: John Best, male   DOB: 04/26/1955, 62 y.o.   MRN: [id number]  HPI Patient here for complete physical. His chronic problems include history of hyperlipidemia, hypertension, nicotine dependence, history of prediabetes, attention deficit disorder. Still smoking about half pack cigarettes per day. 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  Still drinks about 3 ounces of whiskey per day. He has hypertension which is treated with amlodipine and valsartan HCTZ. Blood pressures been stable. He has a brother with type 2 diabetes.  Patient started low-dose CT lung cancer screening last year. He plans to go back again in May. Had colonoscopy last year.  Past Medical History:  Diagnosis Date  . Arthritis   . Depression   . Diabetes mellitus without complication ([place])   . Headache(784.0)   . Hyperlipidemia   . Hypertension    Past Surgical History:  Procedure Laterality Date  . OTHER SURGICAL HISTORY     bilateral thumb fusions   . SHOULDER SURGERY  2013  . THUMB FUSION  2005    reports that he has been smoking cigarettes.  He has a 30.00 pack-year smoking history. He has never used smokeless tobacco. He reports that he drinks alcohol. He reports that he does not use drugs. family history includes Cancer in his paternal grandfather; Diabetes in his brother; Heart disease (age of onset: 63) in his father; Hyperlipidemia in his father and mother; Hypertension in his father. No Known Allergies   Review of Systems  Constitutional: Negative for activity change, appetite change, fatigue, fever and unexpected weight change.  HENT: Negative for congestion, ear pain and trouble swallowing.   Eyes: Negative for pain and visual disturbance.  Respiratory: Negative for cough, shortness of breath and wheezing.   Cardiovascular: Negative for chest pain and palpitations.  Gastrointestinal: Negative for abdominal  distention, abdominal pain, blood in stool, constipation, diarrhea, nausea, rectal pain and vomiting.  Endocrine: Negative for polydipsia and polyuria.  Genitourinary: Negative for dysuria, hematuria and testicular pain.  Musculoskeletal: Negative for arthralgias and joint swelling.  Skin: Negative for rash.  Neurological: Negative for dizziness, syncope and headaches.  Hematological: Negative for adenopathy.  Psychiatric/Behavioral: Negative for confusion and dysphoric mood.       Objective:   Physical Exam  Constitutional: He is oriented to person, place, and time. He appears well-developed and well-nourished. No distress.  HENT:  Head: Normocephalic and atraumatic.  Right Ear: External ear normal.  Left Ear: External ear normal.  Mouth/Throat: Oropharynx is clear and moist.  Eyes: Pupils are equal, round, and reactive to light. Conjunctivae and EOM are normal.  Neck: Normal range of motion. Neck supple. No thyromegaly present.  Cardiovascular: Normal rate, regular rhythm and normal heart sounds.  No murmur heard. Pulmonary/Chest: No respiratory distress. He has no wheezes. He has no rales.  Abdominal: Soft. Bowel sounds are normal. He exhibits no distension and no mass. There is no tenderness. There is no rebound and no guarding.  Musculoskeletal: He exhibits no edema.  Lymphadenopathy:    He has no cervical adenopathy.  Neurological: He is alert and oriented to person, place, and time. He displays normal reflexes. No cranial nerve deficit.  Skin: No rash noted.  Psychiatric: He has a normal mood and affect. His behavior is normal.       Assessment:     Physical exam. He has hypertension which is well controlled. Ongoing  nicotine use.    Plan:     -Recommend smoking cessation and handout given -Continue yearly low-dose CT lung cancer screening -Obtain screening lab work. We did not get hepatitis C antibody since he has donated blood within the past few years and this was  negative -We'll add A1c to labs given prior history of prediabetes range blood sugars and also positive family history  Eulas Post MD [facility]

## 2017-06-17 ENCOUNTER — Telehealth: Payer: Self-pay | Admitting: *Deleted

## 2017-06-17 NOTE — Telephone Encounter (Signed)
Patient requesting Shingrix vaccine. I don't see order/okay in office note from 06/05/17. Okay to schedule nurse visit? Thanks!

## 2017-06-17 NOTE — Telephone Encounter (Signed)
OK.  He was supposed to have been added to waiting list.

## 2017-06-18 NOTE — Telephone Encounter (Signed)
Patient was on wait list, but did not have order. Patient has been schedule for nurse visit for Shingrix.

## 2017-06-19 ENCOUNTER — Ambulatory Visit (INDEPENDENT_AMBULATORY_CARE_PROVIDER_SITE_OTHER): Payer: Managed Care, Other (non HMO) | Admitting: Family Medicine

## 2017-06-19 ENCOUNTER — Ambulatory Visit (INDEPENDENT_AMBULATORY_CARE_PROVIDER_SITE_OTHER)
Admission: RE | Admit: 2017-06-19 | Discharge: 2017-06-19 | Disposition: A | Discharge status: Home / Self Care | Payer: Managed Care, Other (non HMO) | Source: Ambulatory Visit | Attending: Acute Care | Admitting: Acute Care

## 2017-06-19 DIAGNOSIS — Z23 Encounter for immunization: Secondary | ICD-10-CM | POA: Diagnosis not present

## 2017-06-19 DIAGNOSIS — F17210 Nicotine dependence, cigarettes, uncomplicated: Secondary | ICD-10-CM | POA: Diagnosis not present

## 2017-07-02 ENCOUNTER — Other Ambulatory Visit: Payer: Self-pay | Admitting: Acute Care

## 2017-07-02 DIAGNOSIS — F17210 Nicotine dependence, cigarettes, uncomplicated: Principal | ICD-10-CM

## 2017-07-02 DIAGNOSIS — Z122 Encounter for screening for malignant neoplasm of respiratory organs: Secondary | ICD-10-CM

## 2017-07-30 ENCOUNTER — Other Ambulatory Visit: Payer: Self-pay | Admitting: Family Medicine

## 2017-07-30 NOTE — Telephone Encounter (Signed)
methylphenidate (RITALIN LA)  Last office visit 06/07/17 and last refill 04/02/17.  Okay to fill?

## 2017-08-01 NOTE — Telephone Encounter (Signed)
Refills OK. 

## 2017-09-02 ENCOUNTER — Other Ambulatory Visit: Payer: Self-pay | Admitting: Family Medicine

## 2017-09-02 MED ORDER — METHYLPHENIDATE HCL ER (LA) 20 MG PO CP24: ORAL_CAPSULE | ORAL | 0 refills | Status: DC

## 2017-09-02 MED ORDER — METHYLPHENIDATE HCL ER (LA) 20 MG PO CP24: 20.0000 mg | ORAL_CAPSULE | ORAL | 0 refills | Status: DC

## 2017-09-02 NOTE — Telephone Encounter (Signed)
Refills sent electronically

## 2017-09-02 NOTE — Telephone Encounter (Signed)
Last refill 04/02/17 and last office visit 06/05/17.  Okay to fill?

## 2017-10-10 ENCOUNTER — Ambulatory Visit: Payer: Self-pay | Admitting: *Deleted

## 2017-10-10 ENCOUNTER — Other Ambulatory Visit: Payer: Self-pay

## 2017-10-10 ENCOUNTER — Emergency Department (HOSPITAL_COMMUNITY): Payer: Managed Care, Other (non HMO) | Admitting: Anesthesiology

## 2017-10-10 ENCOUNTER — Emergency Department (HOSPITAL_COMMUNITY): Payer: Managed Care, Other (non HMO)

## 2017-10-10 ENCOUNTER — Encounter (HOSPITAL_COMMUNITY)
Admission: EM | Disposition: A | Discharge status: Home / Self Care | Payer: Self-pay | Source: Home / Self Care | Attending: Emergency Medicine

## 2017-10-10 ENCOUNTER — Observation Stay (HOSPITAL_COMMUNITY)
Admission: EM | Admit: 2017-10-10 | Discharge: 2017-10-11 | Disposition: A | Discharge status: Home / Self Care | Payer: Managed Care, Other (non HMO) | Attending: Surgery | Admitting: Surgery

## 2017-10-10 ENCOUNTER — Encounter (HOSPITAL_COMMUNITY): Payer: Self-pay

## 2017-10-10 DIAGNOSIS — F329 Major depressive disorder, single episode, unspecified: Secondary | ICD-10-CM | POA: Diagnosis not present

## 2017-10-10 DIAGNOSIS — K3580 Unspecified acute appendicitis: Principal | ICD-10-CM | POA: Insufficient documentation

## 2017-10-10 DIAGNOSIS — F17210 Nicotine dependence, cigarettes, uncomplicated: Secondary | ICD-10-CM | POA: Diagnosis not present

## 2017-10-10 DIAGNOSIS — Z6831 Body mass index (BMI) 31.0-31.9, adult: Secondary | ICD-10-CM | POA: Diagnosis not present

## 2017-10-10 DIAGNOSIS — R7303 Prediabetes: Secondary | ICD-10-CM | POA: Diagnosis not present

## 2017-10-10 DIAGNOSIS — G43909 Migraine, unspecified, not intractable, without status migrainosus: Secondary | ICD-10-CM | POA: Insufficient documentation

## 2017-10-10 DIAGNOSIS — M1990 Unspecified osteoarthritis, unspecified site: Secondary | ICD-10-CM | POA: Diagnosis not present

## 2017-10-10 DIAGNOSIS — E785 Hyperlipidemia, unspecified: Secondary | ICD-10-CM | POA: Diagnosis not present

## 2017-10-10 DIAGNOSIS — K37 Unspecified appendicitis: Secondary | ICD-10-CM | POA: Diagnosis present

## 2017-10-10 DIAGNOSIS — I10 Essential (primary) hypertension: Secondary | ICD-10-CM | POA: Diagnosis not present

## 2017-10-10 DIAGNOSIS — E669 Obesity, unspecified: Secondary | ICD-10-CM | POA: Insufficient documentation

## 2017-10-10 HISTORY — PX: LAPAROSCOPIC APPENDECTOMY: SHX408

## 2017-10-10 LAB — COMPREHENSIVE METABOLIC PANEL
ALBUMIN: 4.3 g/dL (ref 3.5–5.0)
ALT: 46 U/L — AB (ref 0–44)
AST: 22 U/L (ref 15–41)
Alkaline Phosphatase: 53 U/L (ref 38–126)
Anion gap: 10 (ref 5–15)
BUN: 19 mg/dL (ref 6–20)
CHLORIDE: 102 mmol/L (ref 98–111)
CO2: 26 mmol/L (ref 22–32)
CREATININE: 0.94 mg/dL (ref 0.61–1.24)
Calcium: 9.2 mg/dL (ref 8.9–10.3)
GFR calc Af Amer: >60 mL/min (ref >60)
GLUCOSE: 121 mg/dL — AB (ref 70–99)
Potassium: 3.9 mmol/L (ref 3.5–5.1)
SODIUM: 138 mmol/L (ref 135–145)
Total Bilirubin: 1 mg/dL (ref 0.3–1.2)
Total Protein: 7.7 g/dL (ref 6.5–8.1)

## 2017-10-10 LAB — URINALYSIS, ROUTINE W REFLEX MICROSCOPIC
Bilirubin Urine: NEGATIVE
Glucose, UA: NEGATIVE mg/dL
Ketones, ur: NEGATIVE mg/dL
Leukocytes, UA: NEGATIVE
Nitrite: NEGATIVE
PH: 6 (ref 5.0–8.0)
Protein, ur: 100 mg/dL — AB
SPECIFIC GRAVITY, URINE: 1.025 (ref 1.005–1.030)

## 2017-10-10 LAB — CBC
HCT: 52.7 % — ABNORMAL HIGH (ref 39.0–52.0)
Hemoglobin: 18 g/dL — ABNORMAL HIGH (ref 13.0–17.0)
MCH: 31 pg (ref 26.0–34.0)
MCHC: 34.2 g/dL (ref 30.0–36.0)
MCV: 90.7 fL (ref 78.0–100.0)
PLATELETS: 253 10*3/uL (ref 150–400)
RBC: 5.81 MIL/uL (ref 4.22–5.81)
RDW: 13.9 % (ref 11.5–15.5)
WBC: 16.4 10*3/uL — ABNORMAL HIGH (ref 4.0–10.5)

## 2017-10-10 LAB — LIPASE, BLOOD: LIPASE: 30 U/L (ref 11–51)

## 2017-10-10 SURGERY — APPENDECTOMY, LAPAROSCOPIC
Anesthesia: General

## 2017-10-10 MED ORDER — ACETAMINOPHEN 160 MG/5ML PO SOLN: 325.0000 mg | ORAL | Status: DC | PRN

## 2017-10-10 MED ORDER — IBUPROFEN 200 MG PO TABS
600.0000 mg | ORAL_TABLET | Freq: Four times a day (QID) | ORAL | Status: DC | PRN
  Administered 2017-10-11: 600 mg via ORAL
  Filled 2017-10-10: qty 3

## 2017-10-10 MED ORDER — TRAMADOL HCL 50 MG PO TABS: 50.0000 mg | ORAL_TABLET | Freq: Four times a day (QID) | ORAL | Status: DC | PRN

## 2017-10-10 MED ORDER — OXYCODONE HCL 5 MG PO TABS: 5.0000 mg | ORAL_TABLET | Freq: Once | ORAL | Status: DC | PRN

## 2017-10-10 MED ORDER — PIPERACILLIN-TAZOBACTAM 3.375 G IVPB
3.3750 g | Freq: Three times a day (TID) | INTRAVENOUS | Status: DC
  Administered 2017-10-10: 3.375 g via INTRAVENOUS
  Filled 2017-10-10: qty 50

## 2017-10-10 MED ORDER — PROPOFOL 10 MG/ML IV BOLUS
INTRAVENOUS | Status: DC | PRN
  Administered 2017-10-10: 180 mg via INTRAVENOUS

## 2017-10-10 MED ORDER — ROCURONIUM BROMIDE 10 MG/ML (PF) SYRINGE
PREFILLED_SYRINGE | INTRAVENOUS | Status: DC | PRN
  Administered 2017-10-10: 30 mg via INTRAVENOUS
  Administered 2017-10-10: 10 mg via INTRAVENOUS

## 2017-10-10 MED ORDER — SODIUM CHLORIDE 0.9 % IV SOLN
INTRAVENOUS | Status: DC | PRN
  Administered 2017-10-10: 500 mL via INTRAVENOUS

## 2017-10-10 MED ORDER — ONDANSETRON HCL 4 MG/2ML IJ SOLN: 4.0000 mg | Freq: Once | INTRAMUSCULAR | Status: DC | PRN

## 2017-10-10 MED ORDER — METRONIDAZOLE IN NACL 5-0.79 MG/ML-% IV SOLN
500.0000 mg | Freq: Three times a day (TID) | INTRAVENOUS | Status: DC
  Administered 2017-10-10 – 2017-10-11 (×2): 500 mg via INTRAVENOUS
  Filled 2017-10-10 (×2): qty 100

## 2017-10-10 MED ORDER — PHENYLEPHRINE 40 MCG/ML (10ML) SYRINGE FOR IV PUSH (FOR BLOOD PRESSURE SUPPORT)
PREFILLED_SYRINGE | INTRAVENOUS | Status: AC
  Filled 2017-10-10: qty 10

## 2017-10-10 MED ORDER — ACETAMINOPHEN 325 MG PO TABS: 325.0000 mg | ORAL_TABLET | ORAL | Status: DC | PRN

## 2017-10-10 MED ORDER — IOPAMIDOL (ISOVUE-300) INJECTION 61%
100.0000 mL | Freq: Once | INTRAVENOUS | Status: AC | PRN
  Administered 2017-10-10: 100 mL via INTRAVENOUS

## 2017-10-10 MED ORDER — NICOTINE 21 MG/24HR TD PT24
21.0000 mg | MEDICATED_PATCH | Freq: Every day | TRANSDERMAL | Status: DC
  Filled 2017-10-10: qty 1

## 2017-10-10 MED ORDER — OXYCODONE HCL 5 MG/5ML PO SOLN: 5.0000 mg | Freq: Once | ORAL | Status: DC | PRN

## 2017-10-10 MED ORDER — ONDANSETRON HCL 4 MG/2ML IJ SOLN
4.0000 mg | Freq: Once | INTRAMUSCULAR | Status: AC
  Administered 2017-10-10: 4 mg via INTRAVENOUS
  Filled 2017-10-10: qty 2

## 2017-10-10 MED ORDER — DIPHENHYDRAMINE HCL 25 MG PO CAPS: 25.0000 mg | ORAL_CAPSULE | Freq: Four times a day (QID) | ORAL | Status: DC | PRN

## 2017-10-10 MED ORDER — METOPROLOL TARTRATE 5 MG/5ML IV SOLN: 5.0000 mg | Freq: Four times a day (QID) | INTRAVENOUS | Status: DC | PRN

## 2017-10-10 MED ORDER — METHOCARBAMOL 500 MG PO TABS: 500.0000 mg | ORAL_TABLET | Freq: Four times a day (QID) | ORAL | Status: DC | PRN

## 2017-10-10 MED ORDER — LIDOCAINE 2% (20 MG/ML) 5 ML SYRINGE
INTRAMUSCULAR | Status: AC
  Filled 2017-10-10: qty 5

## 2017-10-10 MED ORDER — PANTOPRAZOLE SODIUM 40 MG PO TBEC
40.0000 mg | DELAYED_RELEASE_TABLET | Freq: Every day | ORAL | Status: DC
  Administered 2017-10-10: 40 mg via ORAL
  Filled 2017-10-10: qty 1

## 2017-10-10 MED ORDER — SUGAMMADEX SODIUM 200 MG/2ML IV SOLN
INTRAVENOUS | Status: AC
  Filled 2017-10-10: qty 2

## 2017-10-10 MED ORDER — ACETAMINOPHEN 500 MG PO TABS
1000.0000 mg | ORAL_TABLET | Freq: Four times a day (QID) | ORAL | Status: DC
  Administered 2017-10-10 – 2017-10-11 (×2): 1000 mg via ORAL
  Filled 2017-10-10 (×2): qty 2

## 2017-10-10 MED ORDER — IOPAMIDOL (ISOVUE-300) INJECTION 61%
INTRAVENOUS | Status: AC
  Filled 2017-10-10: qty 100

## 2017-10-10 MED ORDER — DOCUSATE SODIUM 100 MG PO CAPS: 100.0000 mg | ORAL_CAPSULE | Freq: Two times a day (BID) | ORAL | Status: DC

## 2017-10-10 MED ORDER — MEPERIDINE HCL 50 MG/ML IJ SOLN: 6.2500 mg | INTRAMUSCULAR | Status: DC | PRN

## 2017-10-10 MED ORDER — ONDANSETRON 4 MG PO TBDP: 4.0000 mg | ORAL_TABLET | Freq: Four times a day (QID) | ORAL | Status: DC | PRN

## 2017-10-10 MED ORDER — IBUPROFEN 200 MG PO TABS: 600.0000 mg | ORAL_TABLET | Freq: Four times a day (QID) | ORAL | Status: DC | PRN

## 2017-10-10 MED ORDER — DOCUSATE SODIUM 100 MG PO CAPS
100.0000 mg | ORAL_CAPSULE | Freq: Two times a day (BID) | ORAL | Status: DC
  Administered 2017-10-10: 100 mg via ORAL
  Filled 2017-10-10: qty 1

## 2017-10-10 MED ORDER — MIDAZOLAM HCL 5 MG/5ML IJ SOLN
INTRAMUSCULAR | Status: DC | PRN
  Administered 2017-10-10: 2 mg via INTRAVENOUS

## 2017-10-10 MED ORDER — ONDANSETRON HCL 4 MG/2ML IJ SOLN
INTRAMUSCULAR | Status: AC
  Filled 2017-10-10: qty 2

## 2017-10-10 MED ORDER — 0.9 % SODIUM CHLORIDE (POUR BTL) OPTIME
TOPICAL | Status: DC | PRN
  Administered 2017-10-10: 1000 mL

## 2017-10-10 MED ORDER — HYDRALAZINE HCL 20 MG/ML IJ SOLN: 10.0000 mg | INTRAMUSCULAR | Status: DC | PRN

## 2017-10-10 MED ORDER — FENTANYL CITRATE (PF) 100 MCG/2ML IJ SOLN
INTRAMUSCULAR | Status: AC
  Filled 2017-10-10: qty 2

## 2017-10-10 MED ORDER — ACETAMINOPHEN 500 MG PO TABS: 1000.0000 mg | ORAL_TABLET | Freq: Four times a day (QID) | ORAL | Status: DC

## 2017-10-10 MED ORDER — ENOXAPARIN SODIUM 40 MG/0.4ML ~~LOC~~ SOLN
40.0000 mg | SUBCUTANEOUS | Status: DC
  Administered 2017-10-11: 40 mg via SUBCUTANEOUS
  Filled 2017-10-10: qty 0.4

## 2017-10-10 MED ORDER — DEXAMETHASONE SODIUM PHOSPHATE 10 MG/ML IJ SOLN
INTRAMUSCULAR | Status: DC | PRN
  Administered 2017-10-10: 10 mg via INTRAVENOUS

## 2017-10-10 MED ORDER — GABAPENTIN 300 MG PO CAPS
300.0000 mg | ORAL_CAPSULE | Freq: Two times a day (BID) | ORAL | Status: DC
  Administered 2017-10-10: 300 mg via ORAL
  Filled 2017-10-10: qty 1

## 2017-10-10 MED ORDER — ONDANSETRON HCL 4 MG/2ML IJ SOLN: 4.0000 mg | Freq: Four times a day (QID) | INTRAMUSCULAR | Status: DC | PRN

## 2017-10-10 MED ORDER — SUGAMMADEX SODIUM 200 MG/2ML IV SOLN
INTRAVENOUS | Status: DC | PRN
  Administered 2017-10-10: 200 mg via INTRAVENOUS

## 2017-10-10 MED ORDER — ONDANSETRON HCL 4 MG/2ML IJ SOLN
INTRAMUSCULAR | Status: DC | PRN
  Administered 2017-10-10: 4 mg via INTRAVENOUS

## 2017-10-10 MED ORDER — PROPOFOL 10 MG/ML IV BOLUS
INTRAVENOUS | Status: AC
  Filled 2017-10-10: qty 20

## 2017-10-10 MED ORDER — DEXAMETHASONE SODIUM PHOSPHATE 10 MG/ML IJ SOLN
INTRAMUSCULAR | Status: AC
  Filled 2017-10-10: qty 1

## 2017-10-10 MED ORDER — LACTATED RINGERS IV SOLN
INTRAVENOUS | Status: DC
  Administered 2017-10-10: 16:00:00 via INTRAVENOUS

## 2017-10-10 MED ORDER — HYDROMORPHONE HCL 1 MG/ML IJ SOLN: 0.5000 mg | INTRAMUSCULAR | Status: DC | PRN

## 2017-10-10 MED ORDER — AMLODIPINE BESYLATE 5 MG PO TABS: 5.0000 mg | ORAL_TABLET | Freq: Every day | ORAL | Status: DC

## 2017-10-10 MED ORDER — FENTANYL CITRATE (PF) 100 MCG/2ML IJ SOLN: 25.0000 ug | INTRAMUSCULAR | Status: DC | PRN

## 2017-10-10 MED ORDER — KETOROLAC TROMETHAMINE 15 MG/ML IJ SOLN: 15.0000 mg | INTRAMUSCULAR | Status: DC

## 2017-10-10 MED ORDER — KETOROLAC TROMETHAMINE 30 MG/ML IJ SOLN
INTRAMUSCULAR | Status: DC | PRN
  Administered 2017-10-10: 15 mg via INTRAVENOUS

## 2017-10-10 MED ORDER — OXYCODONE HCL 5 MG/5ML PO SOLN
5.0000 mg | Freq: Once | ORAL | Status: DC | PRN
  Filled 2017-10-10: qty 5

## 2017-10-10 MED ORDER — BUPIVACAINE-EPINEPHRINE 0.25% -1:200000 IJ SOLN
INTRAMUSCULAR | Status: DC | PRN
  Administered 2017-10-10: 30 mL

## 2017-10-10 MED ORDER — SODIUM CHLORIDE 0.9 % IV SOLN
2.0000 g | INTRAVENOUS | Status: DC
  Administered 2017-10-10: 2 g via INTRAVENOUS
  Filled 2017-10-10: qty 2

## 2017-10-10 MED ORDER — OXYCODONE HCL 5 MG PO TABS: 5.0000 mg | ORAL_TABLET | ORAL | Status: DC | PRN

## 2017-10-10 MED ORDER — MIDAZOLAM HCL 2 MG/2ML IJ SOLN
INTRAMUSCULAR | Status: AC
  Filled 2017-10-10: qty 2

## 2017-10-10 MED ORDER — ACETAMINOPHEN 500 MG PO TABS
ORAL_TABLET | ORAL | Status: AC
  Filled 2017-10-10: qty 2

## 2017-10-10 MED ORDER — KETOROLAC TROMETHAMINE 30 MG/ML IJ SOLN
INTRAMUSCULAR | Status: AC
  Filled 2017-10-10: qty 1

## 2017-10-10 MED ORDER — BUPIVACAINE-EPINEPHRINE (PF) 0.25% -1:200000 IJ SOLN
INTRAMUSCULAR | Status: AC
  Filled 2017-10-10: qty 30

## 2017-10-10 MED ORDER — GABAPENTIN 300 MG PO CAPS
300.0000 mg | ORAL_CAPSULE | ORAL | Status: DC
  Administered 2017-10-10: 300 mg via ORAL

## 2017-10-10 MED ORDER — DIPHENHYDRAMINE HCL 50 MG/ML IJ SOLN: 25.0000 mg | Freq: Four times a day (QID) | INTRAMUSCULAR | Status: DC | PRN

## 2017-10-10 MED ORDER — KETOROLAC TROMETHAMINE 15 MG/ML IJ SOLN: 15.0000 mg | Freq: Four times a day (QID) | INTRAMUSCULAR | Status: DC | PRN

## 2017-10-10 MED ORDER — FENTANYL CITRATE (PF) 100 MCG/2ML IJ SOLN
INTRAMUSCULAR | Status: DC | PRN
  Administered 2017-10-10 (×2): 50 ug via INTRAVENOUS

## 2017-10-10 MED ORDER — SODIUM CHLORIDE 0.9 % IV SOLN: INTRAVENOUS | Status: DC

## 2017-10-10 MED ORDER — SUCCINYLCHOLINE CHLORIDE 200 MG/10ML IV SOSY
PREFILLED_SYRINGE | INTRAVENOUS | Status: DC | PRN
  Administered 2017-10-10: 100 mg via INTRAVENOUS

## 2017-10-10 MED ORDER — SODIUM CHLORIDE 0.9 % IV SOLN: Freq: Once | INTRAVENOUS | Status: DC

## 2017-10-10 MED ORDER — PHENYLEPHRINE 40 MCG/ML (10ML) SYRINGE FOR IV PUSH (FOR BLOOD PRESSURE SUPPORT)
PREFILLED_SYRINGE | INTRAVENOUS | Status: DC | PRN
  Administered 2017-10-10: 80 ug via INTRAVENOUS

## 2017-10-10 MED ORDER — LIDOCAINE 2% (20 MG/ML) 5 ML SYRINGE
INTRAMUSCULAR | Status: DC | PRN
  Administered 2017-10-10: 80 mg via INTRAVENOUS

## 2017-10-10 MED ORDER — HYDROMORPHONE HCL 1 MG/ML IJ SOLN: 1.0000 mg | INTRAMUSCULAR | Status: DC | PRN

## 2017-10-10 MED ORDER — MORPHINE SULFATE (PF) 4 MG/ML IV SOLN
4.0000 mg | Freq: Once | INTRAVENOUS | Status: AC
  Administered 2017-10-10: 4 mg via INTRAVENOUS
  Filled 2017-10-10: qty 1

## 2017-10-10 MED ORDER — EPHEDRINE SULFATE-NACL 50-0.9 MG/10ML-% IV SOSY
PREFILLED_SYRINGE | INTRAVENOUS | Status: DC | PRN
  Administered 2017-10-10: 10 mg via INTRAVENOUS
  Administered 2017-10-10: 5 mg via INTRAVENOUS

## 2017-10-10 MED ORDER — EPHEDRINE 5 MG/ML INJ
INTRAVENOUS | Status: AC
  Filled 2017-10-10: qty 10

## 2017-10-10 MED ORDER — ENOXAPARIN SODIUM 40 MG/0.4ML ~~LOC~~ SOLN: 40.0000 mg | SUBCUTANEOUS | Status: DC

## 2017-10-10 MED ORDER — SODIUM CHLORIDE 0.9 % IV BOLUS
1000.0000 mL | Freq: Once | INTRAVENOUS | Status: AC
  Administered 2017-10-10: 1000 mL via INTRAVENOUS

## 2017-10-10 MED ORDER — POTASSIUM CHLORIDE IN NACL 20-0.45 MEQ/L-% IV SOLN: INTRAVENOUS | Status: DC

## 2017-10-10 MED ORDER — ACETAMINOPHEN 500 MG PO TABS
1000.0000 mg | ORAL_TABLET | ORAL | Status: DC
  Administered 2017-10-10: 1000 mg via ORAL

## 2017-10-10 MED ORDER — ROCURONIUM BROMIDE 10 MG/ML (PF) SYRINGE
PREFILLED_SYRINGE | INTRAVENOUS | Status: AC
  Filled 2017-10-10: qty 10

## 2017-10-10 MED ORDER — GABAPENTIN 300 MG PO CAPS
ORAL_CAPSULE | ORAL | Status: AC
  Filled 2017-10-10: qty 1

## 2017-10-10 SURGICAL SUPPLY — 48 items
ADH SKN CLS APL DERMABOND .7 (GAUZE/BANDAGES/DRESSINGS) ×1
APPLIER CLIP 5 13 M/L LIGAMAX5 (MISCELLANEOUS)
APPLIER CLIP ROT 10 11.4 M/L (STAPLE)
APR CLP MED LRG 11.4X10 (STAPLE)
APR CLP MED LRG 5 ANG JAW (MISCELLANEOUS)
BAG SPEC RTRVL LRG 6X4 10 (ENDOMECHANICALS) ×1
CABLE HIGH FREQUENCY MONO STRZ (ELECTRODE) ×3 IMPLANT
CHLORAPREP W/TINT 26ML (MISCELLANEOUS) ×3 IMPLANT
CLIP APPLIE 5 13 M/L LIGAMAX5 (MISCELLANEOUS) IMPLANT
CLIP APPLIE ROT 10 11.4 M/L (STAPLE) IMPLANT
COVER SURGICAL LIGHT HANDLE (MISCELLANEOUS) ×3 IMPLANT
CUTTER FLEX LINEAR 45M (STAPLE) ×3 IMPLANT
DECANTER SPIKE VIAL GLASS SM (MISCELLANEOUS) ×3 IMPLANT
DERMABOND ADVANCED (GAUZE/BANDAGES/DRESSINGS) ×2
DERMABOND ADVANCED .7 DNX12 (GAUZE/BANDAGES/DRESSINGS) ×1 IMPLANT
DRAIN CHANNEL 19F RND (DRAIN) IMPLANT
DRAPE LAPAROSCOPIC ABDOMINAL (DRAPES) ×2 IMPLANT
ELECT REM PT RETURN 15FT ADLT (MISCELLANEOUS) ×3 IMPLANT
ENDOLOOP SUT PDS II  0 18 (SUTURE)
ENDOLOOP SUT PDS II 0 18 (SUTURE) IMPLANT
EVACUATOR SILICONE 100CC (DRAIN) IMPLANT
GLOVE BIO SURGEON STRL SZ 6 (GLOVE) ×3 IMPLANT
GLOVE INDICATOR 6.5 STRL GRN (GLOVE) ×3 IMPLANT
GOWN STRL REUS W/ TWL LRG LVL3 (GOWN DISPOSABLE) ×1 IMPLANT
GOWN STRL REUS W/TWL LRG LVL3 (GOWN DISPOSABLE) ×3
GOWN STRL REUS W/TWL XL LVL3 (GOWN DISPOSABLE) ×3 IMPLANT
GRASPER SUT TROCAR 14GX15 (MISCELLANEOUS) ×2 IMPLANT
IRRIG SUCT STRYKERFLOW 2 WTIP (MISCELLANEOUS) ×3
IRRIGATION SUCT STRKRFLW 2 WTP (MISCELLANEOUS) ×1 IMPLANT
KIT BASIN OR (CUSTOM PROCEDURE TRAY) ×3 IMPLANT
NEEDLE INSUFFLATION 14GA 120MM (NEEDLE) ×3 IMPLANT
POUCH SPECIMEN RETRIEVAL 10MM (ENDOMECHANICALS) ×3 IMPLANT
RELOAD 45 VASCULAR/THIN (ENDOMECHANICALS) ×3 IMPLANT
RELOAD STAPLE 45 3.5 BLU ETS (ENDOMECHANICALS) IMPLANT
RELOAD STAPLE TA45 3.5 REG BLU (ENDOMECHANICALS) IMPLANT
SCISSORS LAP 5X35 DISP (ENDOMECHANICALS) ×3 IMPLANT
SHEARS HARMONIC ACE PLUS 36CM (ENDOMECHANICALS) ×2 IMPLANT
SLEEVE XCEL OPT CAN 5 100 (ENDOMECHANICALS) ×3 IMPLANT
SUT ETHILON 2 0 PS N (SUTURE) IMPLANT
SUT MNCRL AB 4-0 PS2 18 (SUTURE) ×3 IMPLANT
SYR 10ML ECCENTRIC (SYRINGE) ×2 IMPLANT
TOWEL OR 17X26 10 PK STRL BLUE (TOWEL DISPOSABLE) ×3 IMPLANT
TOWEL OR NON WOVEN STRL DISP B (DISPOSABLE) ×3 IMPLANT
TRAY FOLEY MTR SLVR 16FR STAT (SET/KITS/TRAYS/PACK) ×3 IMPLANT
TRAY LAPAROSCOPIC (CUSTOM PROCEDURE TRAY) ×3 IMPLANT
TROCAR BLADELESS OPT 5 100 (ENDOMECHANICALS) ×3 IMPLANT
TROCAR XCEL 12X100 BLDLESS (ENDOMECHANICALS) ×3 IMPLANT
TUBING INSUF HEATED (TUBING) ×3 IMPLANT

## 2017-10-10 NOTE — ED Notes (Signed)
1st URINE REQUEST MADE 

## 2017-10-10 NOTE — Telephone Encounter (Signed)
Patient currently at [facility] ED. 

## 2017-10-10 NOTE — Discharge Instructions (Signed)

## 2017-10-10 NOTE — ED Triage Notes (Signed)
Patient c/o right lower abdominal pain and vomiting x 2 since yesterday. Patient called his PCP today and was instructed to come to the ED to r/o appendicitis.

## 2017-10-10 NOTE — H&P (Signed)
[facility] Surgery Consult Note  John Best [birth year]  [id number].    Requesting MD: Venora Maples Chief Complaint/Reason for Consult: Acute appendicitis HPI:  Patient is a 62 year old male who presents to [facility] ED with 24h hx of RLQ pain. Patient describes pain as sharp, constant and non-radiating. Was worse in intensity yesterday and has improved some with passing flatus. Reports associated chills, nausea/vomiting. Emesis bilious, non-bloody. Denies fever, diarrhea, blood in stools, urinary symptoms, chest pain SOB. PMH significant for HTN and HLD. No blood thinning medications. No past abdominal surgeries. NKDA. Patient does report he smokes about 1 ppd and has 1-2 alcoholic beverages per day, denies ever having symptoms of alcohol withdrawal. Denies illicit drug use. His wife is currently in [place] and he plans to meet her there over the next 1-2 weeks.   ROS: Review of Systems  Constitutional: Positive for chills. Negative for fever.  Respiratory: Negative for shortness of breath and wheezing.   Cardiovascular: Negative for chest pain and palpitations.  Gastrointestinal: Positive for abdominal pain, nausea and vomiting. Negative for blood in stool, constipation, diarrhea and melena.  Genitourinary: Negative for dysuria, frequency and urgency.  All other systems reviewed and are negative.   Family History  Problem Relation Age of Onset  . Hyperlipidemia Mother   . Heart disease Father 13       CAD  . Hyperlipidemia Father   . Hypertension Father   . Diabetes Brother        type ll  . Cancer Paternal Grandfather        colon cancer    Past Medical History:  Diagnosis Date  . Arthritis   . Depression   . Headache(784.0)   . Hyperlipidemia   . Hypertension     Past Surgical History:  Procedure Laterality Date  . OTHER SURGICAL HISTORY     bilateral thumb fusions   . SHOULDER SURGERY  2013  . THUMB FUSION  2005    Social History:  reports that he has been smoking  cigarettes. He has a 30.00 pack-year smoking history. He has never used smokeless tobacco. He reports that he drinks alcohol. He reports that he does not use drugs.  Allergies: No Known Allergies   (Not in a hospital admission)  Blood pressure 111/73, pulse 81, temperature 99 F (37.2 C), temperature source Oral, resp. rate 16, height _0  (1.702 m), weight 90.9 kg, SpO2 96 %. Physical Exam: Physical Exam  Constitutional: He is oriented to person, place, and time. He appears well-developed and well-nourished.  Non-toxic appearance. No distress.  HENT:  Head: Normocephalic and atraumatic.  Mouth/Throat: Oropharynx is clear and moist.  Eyes: Pupils are equal, round, and reactive to light. EOM are normal. No scleral icterus.  Cardiovascular: Normal rate, regular rhythm, normal heart sounds and intact distal pulses.  Pulmonary/Chest: Effort normal and breath sounds normal.  Abdominal: Soft. Normal appearance. He exhibits distension (mild). Bowel sounds are decreased. There is tenderness in the right lower quadrant. There is guarding (minimal ) and tenderness at McBurney's point. There is no rigidity and no rebound.  Musculoskeletal:  ROM grossly intact in bilateral upper and lower extremities  Neurological: He is alert and oriented to person, place, and time.  Skin: Skin is warm and dry. He is not diaphoretic.  Psychiatric: He has a normal mood and affect. His behavior is normal.  Nursing note and vitals reviewed.   Results for orders placed or performed during the hospital encounter of 10/10/17 (from the  past 48 hour(s))  Lipase, blood     Status: None   Collection Time: 10/10/17 11:30 AM  Result Value Ref Range   Lipase 30 11 - 51 U/L    Comment: Performed at [facility], [place] [address], [place], [place] [address]  Comprehensive metabolic panel     Status: Abnormal   Collection Time: 10/10/17 11:30 AM  Result Value Ref Range   Sodium 138 135 - 145 mmol/L    Potassium 3.9 3.5 - 5.1 mmol/L   Chloride 102 98 - 111 mmol/L   CO2 26 22 - 32 mmol/L   Glucose, Bld 121 (H) 70 - 99 mg/dL   BUN 19 6 - 20 mg/dL   Creatinine, Ser 0.94 0.61 - 1.24 mg/dL   Calcium 9.2 8.9 - 10.3 mg/dL   Total Protein 7.7 6.5 - 8.1 g/dL   Albumin 4.3 3.5 - 5.0 g/dL   AST 22 15 - 41 U/L   ALT 46 (H) 0 - 44 U/L   Alkaline Phosphatase 53 38 - 126 U/L   Total Bilirubin 1.0 0.3 - 1.2 mg/dL   GFR calc non Af Amer >60 >60 mL/min   GFR calc Af Amer >60 >60 mL/min    Comment: (NOTE) The eGFR has been calculated using the CKD EPI equation. This calculation has not been validated in all clinical situations. eGFR's persistently <60 mL/min signify possible Chronic Kidney Disease.    Anion gap 10 5 - 15    Comment: Performed at [facility], [place] [address], [place], [place] [address]  CBC     Status: Abnormal   Collection Time: 10/10/17 11:30 AM  Result Value Ref Range   WBC 16.4 (H) 4.0 - 10.5 K/uL   RBC 5.81 4.22 - 5.81 MIL/uL   Hemoglobin 18.0 (H) 13.0 - 17.0 g/dL   HCT 52.7 (H) 39.0 - 52.0 %   MCV 90.7 78.0 - 100.0 fL   MCH 31.0 26.0 - 34.0 pg   MCHC 34.2 30.0 - 36.0 g/dL   RDW 13.9 11.5 - 15.5 %   Platelets 253 150 - 400 K/uL    Comment: Performed at [facility], [place] [address], [place], [place] [address]  Urinalysis, Routine w reflex microscopic     Status: Abnormal   Collection Time: 10/10/17 11:30 AM  Result Value Ref Range   Color, Urine YELLOW YELLOW   APPearance CLEAR CLEAR   Specific Gravity, Urine 1.025 1.005 - 1.030   pH 6.0 5.0 - 8.0   Glucose, UA NEGATIVE NEGATIVE mg/dL   Hgb urine dipstick SMALL (A) NEGATIVE   Bilirubin Urine NEGATIVE NEGATIVE   Ketones, ur NEGATIVE NEGATIVE mg/dL   Protein, ur 100 (A) NEGATIVE mg/dL   Nitrite NEGATIVE NEGATIVE   Leukocytes, UA NEGATIVE NEGATIVE   RBC / HPF 21-50 0 - 5 RBC/hpf   WBC, UA 6-10 0 - 5 WBC/hpf   Bacteria, UA RARE (A) NONE SEEN   Squamous Epithelial / LPF 0-5 0 -  5   Mucus PRESENT     Comment: Performed at [facility], [place] [address], [place], [place] [address]   Ct Abdomen Pelvis W Contrast  Result Date: 10/10/2017 CLINICAL DATA:  Acute right lower quadrant abdominal pain, some vomiting EXAM: CT ABDOMEN AND PELVIS WITH CONTRAST TECHNIQUE: Multidetector CT imaging of the abdomen and pelvis was performed using the standard protocol following bolus administration of intravenous contrast. CONTRAST:  153m ISOVUE-300 IOPAMIDOL (ISOVUE-300) INJECTION 61% COMPARISON:  None. FINDINGS: Lower chest: The lung bases are clear. Only a tiny 4 mm noncalcified nodule is present in the periphery of the left lower lobe on image 23 of doubtful clinical significance. The heart is mildly enlarged. No pericardial effusion is seen. Hepatobiliary: The liver is diffusely low in attenuation with some sparing near the gallbladder, consistent with diffuse hepatic steatosis. No focal hepatic abnormality is seen other than calcified granulomas in the mid right lobe. No calcified gallstones are seen. Pancreas: The pancreas is normal in size in the pancreatic duct is not dilated. Spleen: The spleen is unremarkable. Adrenals/Urinary Tract: There do appear to be small adrenal adenomas present of doubtful clinical significance, right slightly more numerous than left. No renal calculi are seen. There are small renal cysts noted bilaterally. On delayed images, the pelvocaliceal systems are unremarkable. The ureters are normal in caliber. The urinary bladder is not well distended but no abnormality is noted. Stomach/Bowel: The stomach is decompressed and difficult to assess. No small bowel dilatation is seen. There are a few scattered rectosigmoid colon diverticula present. Some right-sided colonic diverticula also are noted. However, there is and inflammatory process within the right lower quadrant with periappendiceal strandiness. The appendix measures up to 12 mm in diameter and the  and wall of the appendix does appear to be enhancing. These findings are consistent with acute uncomplicated appendicitis. The terminal ileum is unremarkable. Vascular/Lymphatic: There is moderate to severe abdominal aortic atherosclerosis present for age. No adenopathy is seen. Reproductive: The prostate is small for age. Other: No abdominal wall hernia is seen. Musculoskeletal: There are mild-to-moderate degenerative changes within both hips and within the lower lumbar spine. Degenerative disc disease is most marked at L4-5. IMPRESSION: 1. Findings consistent with acute uncomplicated appendicitis. No abscess or perforation is evident. 2. Diffuse hepatic steatosis with some areas of sparing. 3. Degenerative disc disease at L4-5. 4. Small 4 mm nodule noncalcified in the periphery the left lower lobe of doubtful significance in this age group. Electronically Signed   By: Ivar Drape M.D.   On: 10/10/2017 13:51      Assessment/Plan HTN HLD Depression Arthritis  Acute appendicitis - CT: inflammatory process within the right lower quadrant with periappendiceal strandiness. The appendix measures up to 12 mm in diameter and the and wall of the appendix does appear to be enhancing - WBC 16.4, possible fever/chills at home - Exam consistent with acute appendicitis without peritonitis - plan for laparoscopic appendectomy this afternoon   Brigid Re, [facility] Surgery 10/10/2017, 2:13 PM Pager: [phone] Consults: [phone] Mon-Fri 7:00 am-4:30 pm Sat-Sun 7:00 am-11:30 am

## 2017-10-10 NOTE — Anesthesia Procedure Notes (Signed)
Procedure Name: Intubation Date/Time: 10/10/2017 4:19 PM Performed by: Victoriano Lain, CRNA Pre-anesthesia Checklist: Patient identified, Emergency Drugs available, Suction available, Patient being monitored and Timeout performed Patient Re-evaluated:Patient Re-evaluated prior to induction Oxygen Delivery Method: Circle system utilized Preoxygenation: Pre-oxygenation with 100% oxygen Induction Type: IV induction Ventilation: Mask ventilation without difficulty Laryngoscope Size: Mac and 4 Grade View: Grade I Tube type: Oral Tube size: 7.5 mm Number of attempts: 1 Airway Equipment and Method: Stylet Placement Confirmation: ETT inserted through vocal cords under direct vision,  positive ETCO2 and breath sounds checked- equal and bilateral Secured at: 22 cm Tube secured with: Tape Dental Injury: Teeth and Oropharynx as per pre-operative assessment

## 2017-10-10 NOTE — Transfer of Care (Addendum)
Immediate Anesthesia Transfer of Care Note  Patient: John Best  Procedure(s) Performed: APPENDECTOMY LAPAROSCOPIC (N/A )  Patient Location: PACU  Anesthesia Type:General  Level of Consciousness: awake, alert , oriented and patient cooperative  Airway & Oxygen Therapy: Patient Spontanous Breathing and Patient connected to face mask oxygen  Post-op Assessment: Report given to RN and Post -op Vital signs reviewed and stable  Post vital signs: Reviewed and stable  Last Vitals:  Vitals Value Taken Time  BP    Temp    Pulse 54 10/10/2017  5:23 PM  Resp 25 10/10/2017  5:23 PM  SpO2 99% 10/10/2017  5:23 PM  Vitals shown include unvalidated device data.  Last Pain:  Vitals:   10/10/17 1549  TempSrc: Oral  PainSc:          Complications: No apparent anesthesia complications

## 2017-10-10 NOTE — Telephone Encounter (Signed)
Pt reports severe abdominal pain, right sided, sudden onset last night, 8-9/10 pain; radiates to umbilicus at times. Reports emesis x 2 last night "Bile," diaphoretic, "Soaked sheets throughout night." States right side of abdomen is tender to touch and "Hard."  Reports LBM wnl 2 days ago; small BM yesterday.  Unsure if febrile but "Chills during night."  Pt directed to ED; states he will follow disposition.  Reason for Disposition . [1] SEVERE pain (e.g., excruciating) AND [2] present > 1 hour  Answer Assessment - Initial Assessment Questions 1. LOCATION: "Where does it hurt?"      Right side abdomen 2. RADIATION: "Does the pain shoot anywhere else?" (e.g., chest, back)     towards umbilicus 3. ONSET: "When did the pain begin?" (Minutes, hours or days ago)      LAst night 4. SUDDEN: "Gradual or sudden onset?"     Sudden 5. PATTERN "Does the pain come and go, or is it constant?"    - If constant: "Is it getting better, staying the same, or worsening?"      (Note: Constant means the pain never goes away completely; most serious pain is constant and it progresses)     - If intermittent: "How long does it last?" "Do you have pain now?"     (Note: Intermittent means the pain goes away completely between bouts)     constant 6. SEVERITY: "How bad is the pain?"  (e.g., Scale 1-10; mild, moderate, or severe)    - MILD (1-3): doesn't interfere with normal activities, abdomen soft and not tender to touch     - MODERATE (4-7): interferes with normal activities or awakens from sleep, tender to touch     - SEVERE (8-10): excruciating pain, doubled over, unable to do any normal activities       8-9/10 7. RECURRENT SYMPTOM: "Have you ever had this type of abdominal pain before?" If so, ask: "When was the last time?" and "What happened that time?"      no 8. CAUSE: "What do you think is causing the abdominal pain?"     unsure 9. RELIEVING/AGGRAVATING FACTORS: "What makes it better or worse?" (e.g.,  movement, antacids, bowel movement)     Gassy, LBM WNL 2 days ago, small BM yesterday 10. OTHER SYMPTOMS: "Has there been any vomiting, diarrhea, constipation, or urine problems?"       Emesis last night x 2,"Bile", diaphoretic, "Soaked sheets throughout night."  Protocols used: ABDOMINAL PAIN - MALE-A-AH

## 2017-10-10 NOTE — Op Note (Signed)
Operative Report  John Best 62 y.o. male  [id number]  [id number]  10/10/2017  Surgeon: Clovis Riley   Assistant: none  Procedure performed: Laparoscopic Appendectomy  Preop diagnosis: Acute appendicitis  Post-op diagnosis/intraop findings: Acute appendicitis - with gangrene  Specimens: appendix  EBL: minimal  Complications: none  Description of procedure: After obtaining informed consent the patient was brought to the operating room. Antibiotics and subcutaneous heparin were administered. SCD's were applied. General endotracheal anesthesia was initiated and a formal time-out was performed. The abdomen was prepped and draped in the usual sterile fashion and the abdomen was entered using an infraumbilical Veress needle and insufflated to 15 mmHg. A 5 mm trocar and camera were then introduced, the abdomen was inspected and there is no evidence of injury from our entry. A suprapubic 5 mm trocar and a left lower quadrant 12 mm trocar were introduced under direct visualization following infiltration with local. The patient was then placed in Trendelenburg and rotated to the left and the small bowel was reflected cephalad. The appendix was visualized: it appeared acutely inflamed with patches of gangrene and a purulent exudate and scant surrounding purulent fluid was present. The appendix was partially retrocecal. A combination of blunt dissection and harmonic scalpel were used to free it of its retroperitoneal attachments. Great care was taken to ensure no injury to surrounding retroperitoneal structures, cecum or terminal ileum. The appendix was skeletonized down to the base using the harmonic scalpel to divide the meoappendix. A blue load linear cutting stapler was used to transect the appendix from the cecum.  Hemostasis was ensured. The staple line was viable and hemostatic. The appendix was placed in an Endo Catch bag and removed through our 12 mm trocar site. The 24mm trocar  site in the left lower quadrant was closed with a 0 vicryl in the fascia under direct visualization using a PMI device. The abdomen was desufflated and all trocars removed. The skin incisions were closed with running subcuticular monocryl and Dermabond. The patient was awakened, extubated and transported to the recovery room in stable condition.   All counts were correct at the completion of the case.

## 2017-10-10 NOTE — Anesthesia Preprocedure Evaluation (Signed)
Anesthesia Evaluation  Patient identified by MRN, date of birth, ID band Patient awake    Reviewed: Allergy & Precautions, H&P , NPO status , Patient's Chart, lab work & pertinent test results  Airway Mallampati: II  TM Distance: >3 FB Neck ROM: full    Dental no notable dental hx.    Pulmonary neg pulmonary ROS, Current Smoker,    Pulmonary exam normal breath sounds clear to auscultation       Cardiovascular Exercise Tolerance: Good hypertension, Pt. on medications negative cardio ROS   Rhythm:regular Rate:Normal     Neuro/Psych negative neurological ROS  negative psych ROS   GI/Hepatic negative GI ROS, Neg liver ROS,   Endo/Other  negative endocrine ROS  Renal/GU negative Renal ROS  negative genitourinary   Musculoskeletal   Abdominal (+) + obese,   Peds  Hematology negative hematology ROS (+)   Anesthesia Other Findings   Reproductive/Obstetrics negative OB ROS                             Anesthesia Physical  Anesthesia Plan  ASA: II  Anesthesia Plan: General   Post-op Pain Management:  Regional for Post-op pain   Induction: Intravenous  PONV Risk Score and Plan:   Airway Management Planned: Oral ETT  Additional Equipment:   Intra-op Plan:   Post-operative Plan: Extubation in OR  Informed Consent: I have reviewed the patients History and Physical, chart, labs and discussed the procedure including the risks, benefits and alternatives for the proposed anesthesia with the patient or authorized representative who has indicated his/her understanding and acceptance.   Dental Advisory Given  Plan Discussed with: CRNA, Anesthesiologist and Surgeon  Anesthesia Plan Comments: (  )        Anesthesia Quick Evaluation

## 2017-10-10 NOTE — ED Provider Notes (Signed)
[facility] DEPT Provider Note   CSN: [id number] Arrival date & time: 10/10/17  3291     History   Chief Complaint Chief Complaint  Patient presents with  . Abdominal Pain    HPI John Best is a 62 y.o. male.  HPI 62 year old male presents the emergency department with complaints of progressive right lower quadrant abdominal pain over the past 24 hours with nausea and vomiting last night.  Chills last night.  No documented fever.  No prior history of abdominal surgery.  He is point tender in the right lower quadrant and called his primary care physician who recommended he come to the ER for further evaluation.  Patient has no urinary complaints.  Symptoms are moderate in severity and worse with movement and palpation of his right lower quadrant.   Past Medical History:  Diagnosis Date  . Arthritis   . Depression   . Headache(784.0)   . Hyperlipidemia   . Hypertension     Patient Active Problem List   Diagnosis Date Noted  . Prediabetes 06/04/2016  . Nicotine use disorder 05/30/2015  . Obesity (BMI 30-39.9) 12/08/2012  . ADD (attention deficit disorder) 05/16/2011  . Hypertension 05/16/2011  . Hyperlipidemia 05/16/2011  . Migraine 05/16/2011    Past Surgical History:  Procedure Laterality Date  . OTHER SURGICAL HISTORY     bilateral thumb fusions   . SHOULDER SURGERY  2013  . THUMB FUSION  2005        Home Medications    Prior to Admission medications   Medication Sig Start Date End Date Taking? Authorizing Provider  amLODipine (NORVASC) 5 MG tablet TAKE 1 TABLET ONCE DAILY. 08/01/17  Yes Burchette, Alinda Sierras, MD  methylphenidate (RITALIN LA) 20 MG 24 hr capsule TAKE 1 CAPSULE EACH MORNING. 09/02/17  Yes Burchette, Alinda Sierras, MD  Multiple Vitamin (MULTIVITAMIN) tablet Take 1 tablet by mouth daily.   Yes [provider]  valsartan-hydrochlorothiazide (DIOVAN HCT) 160-12.5 MG tablet Take 1 tablet by mouth daily. 11/16/16   Yes Burchette, Alinda Sierras, MD  buPROPion (WELLBUTRIN SR) 150 MG 12 hr tablet Take 1 tablet (150 mg total) by mouth 2 (two) times daily. Patient not taking: Reported on 10/10/2017 07/18/16   Eulas Post, MD  methylphenidate (RITALIN LA) 20 MG 24 hr capsule TAKE 1 CAPSULE EACH MORNING. Patient not taking: Reported on 10/10/2017 09/02/17   Eulas Post, MD  methylphenidate (RITALIN LA) 20 MG 24 hr capsule Take 1 capsule (20 mg total) by mouth every morning. Patient not taking: Reported on 10/10/2017 09/02/17   Eulas Post, MD  valsartan (DIOVAN) 160 MG tablet TAKE 1 TABLET ONCE DAILY. Patient not taking: Reported on 10/10/2017 08/01/17   Eulas Post, MD  losartan (COZAAR) 50 MG tablet Take 50 mg by mouth every morning.   05/16/11  [provider]    Family History Family History  Problem Relation Age of Onset  . Hyperlipidemia Mother   . Heart disease Father 60       CAD  . Hyperlipidemia Father   . Hypertension Father   . Diabetes Brother        type ll  . Cancer Paternal Grandfather        colon cancer    Social History Social History   Tobacco Use  . Smoking status: Current Every Day Smoker    Packs/day: 1.00    Years: 30.00    Pack years: 30.00    Types: Cigarettes  .  Smokeless tobacco: Never Used  Substance Use Topics  . Alcohol use: Yes    Comment: 14 drinks/wk drinks combination of all   . Drug use: No     Allergies   Patient has no known allergies.   Review of Systems Review of Systems  All other systems reviewed and are negative.    Physical Exam Updated Vital Signs BP 111/73   Pulse 81   Temp 99 F (37.2 C) (Oral)   Resp 16   Ht 5\' 7"  (1.702 m)   Wt 90.9 kg   SpO2 96%   BMI 31.39 kg/m   Physical Exam  Constitutional: He is oriented to person, place, and time. He appears well-developed and well-nourished.  HENT:  Head: Normocephalic and atraumatic.  Eyes: EOM are normal.  Neck: Normal range of motion.    Cardiovascular: Normal rate, regular rhythm, normal heart sounds and intact distal pulses.  Pulmonary/Chest: Effort normal and breath sounds normal. No respiratory distress.  Abdominal: Soft. He exhibits no distension.  Right lower quadrant tenderness  Musculoskeletal: Normal range of motion.  Neurological: He is alert and oriented to person, place, and time.  Skin: Skin is warm and dry.  Psychiatric: He has a normal mood and affect. Judgment normal.  Nursing note and vitals reviewed.    ED Treatments / Results  Labs (all labs ordered are listed, but only abnormal results are displayed) Labs Reviewed  COMPREHENSIVE METABOLIC PANEL - Abnormal; Notable for the following components:      Result Value   Glucose, Bld 121 (*)    ALT 46 (*)    All other components within normal limits  CBC - Abnormal; Notable for the following components:   WBC 16.4 (*)    Hemoglobin 18.0 (*)    HCT 52.7 (*)    All other components within normal limits  URINALYSIS, ROUTINE W REFLEX MICROSCOPIC - Abnormal; Notable for the following components:   Hgb urine dipstick SMALL (*)    Protein, ur 100 (*)    Bacteria, UA RARE (*)    All other components within normal limits  LIPASE, BLOOD    EKG None  Radiology Ct Abdomen Pelvis W Contrast  Result Date: 10/10/2017 CLINICAL DATA:  Acute right lower quadrant abdominal pain, some vomiting EXAM: CT ABDOMEN AND PELVIS WITH CONTRAST TECHNIQUE: Multidetector CT imaging of the abdomen and pelvis was performed using the standard protocol following bolus administration of intravenous contrast. CONTRAST:  169mL ISOVUE-300 IOPAMIDOL (ISOVUE-300) INJECTION 61% COMPARISON:  None. FINDINGS: Lower chest: The lung bases are clear. Only a tiny 4 mm noncalcified nodule is present in the periphery of the left lower lobe on image 23 of doubtful clinical significance. The heart is mildly enlarged. No pericardial effusion is seen. Hepatobiliary: The liver is diffusely low in  attenuation with some sparing near the gallbladder, consistent with diffuse hepatic steatosis. No focal hepatic abnormality is seen other than calcified granulomas in the mid right lobe. No calcified gallstones are seen. Pancreas: The pancreas is normal in size in the pancreatic duct is not dilated. Spleen: The spleen is unremarkable. Adrenals/Urinary Tract: There do appear to be small adrenal adenomas present of doubtful clinical significance, right slightly more numerous than left. No renal calculi are seen. There are small renal cysts noted bilaterally. On delayed images, the pelvocaliceal systems are unremarkable. The ureters are normal in caliber. The urinary bladder is not well distended but no abnormality is noted. Stomach/Bowel: The stomach is decompressed and difficult to assess. No small  bowel dilatation is seen. There are a few scattered rectosigmoid colon diverticula present. Some right-sided colonic diverticula also are noted. However, there is and inflammatory process within the right lower quadrant with periappendiceal strandiness. The appendix measures up to 12 mm in diameter and the and wall of the appendix does appear to be enhancing. These findings are consistent with acute uncomplicated appendicitis. The terminal ileum is unremarkable. Vascular/Lymphatic: There is moderate to severe abdominal aortic atherosclerosis present for age. No adenopathy is seen. Reproductive: The prostate is small for age. Other: No abdominal wall hernia is seen. Musculoskeletal: There are mild-to-moderate degenerative changes within both hips and within the lower lumbar spine. Degenerative disc disease is most marked at L4-5. IMPRESSION: 1. Findings consistent with acute uncomplicated appendicitis. No abscess or perforation is evident. 2. Diffuse hepatic steatosis with some areas of sparing. 3. Degenerative disc disease at L4-5. 4. Small 4 mm nodule noncalcified in the periphery the left lower lobe of doubtful  significance in this age group. Electronically Signed   By: Ivar Drape M.D.   On: 10/10/2017 13:51    Procedures Procedures (including critical care time)  Medications Ordered in ED Medications  iopamidol (ISOVUE-300) 61 % injection (has no administration in time range)  morphine 4 MG/ML injection 4 mg (4 mg Intravenous Given 10/10/17 1155)  ondansetron (ZOFRAN) injection 4 mg (4 mg Intravenous Given 10/10/17 1154)  sodium chloride 0.9 % bolus 1,000 mL (0 mLs Intravenous Stopped 10/10/17 1305)  iopamidol (ISOVUE-300) 61 % injection 100 mL (100 mLs Intravenous Contrast Given 10/10/17 1332)     Initial Impression / Assessment and Plan / ED Course  I have reviewed the triage vital signs and the nursing notes.  Pertinent labs & imaging results that were available during my care of the patient were reviewed by me and considered in my medical decision making (see chart for details).     Acute appendicitis.  Consultation to general surgery.  N.p.o.  Pain improved.  Patient will be admitted to the hospital for appendectomy  Consult patient: [facility] surgery  Final Clinical Impressions(s) / ED Diagnoses   Final diagnoses:  Acute appendicitis, unspecified acute appendicitis type    ED Discharge Orders    None       Jola Schmidt, MD 10/10/17 1426

## 2017-10-10 NOTE — ED Notes (Signed)
Son is on his way to pick up belongings.

## 2017-10-10 NOTE — Anesthesia Postprocedure Evaluation (Signed)
Anesthesia Post Note  Patient: John Best  Procedure(s) Performed: APPENDECTOMY LAPAROSCOPIC (N/A )     Patient location during evaluation: PACU Anesthesia Type: General Level of consciousness: awake and alert Pain management: pain level controlled Vital Signs Assessment: post-procedure vital signs reviewed and stable Respiratory status: spontaneous breathing, nonlabored ventilation, respiratory function stable and patient connected to nasal cannula oxygen Cardiovascular status: blood pressure returned to baseline and stable Postop Assessment: no apparent nausea or vomiting Anesthetic complications: no    Last Vitals:  Vitals:   10/10/17 1754 10/10/17 1806  BP: 107/65 121/76  Pulse: 72 74  Resp: 13 16  Temp: 37.2 C 36.7 C  SpO2: 93% 93%    Last Pain:  Vitals:   10/10/17 1806  TempSrc: Oral  PainSc:                  [name]

## 2017-10-10 NOTE — Telephone Encounter (Signed)
Monitor for ED arrival. 

## 2017-10-11 ENCOUNTER — Encounter (HOSPITAL_COMMUNITY): Payer: Self-pay | Admitting: Surgery

## 2017-10-11 LAB — BASIC METABOLIC PANEL
ANION GAP: 10 (ref 5–15)
BUN: 22 mg/dL — ABNORMAL HIGH (ref 6–20)
CALCIUM: 8.6 mg/dL — AB (ref 8.9–10.3)
CO2: 23 mmol/L (ref 22–32)
Chloride: 104 mmol/L (ref 98–111)
Creatinine, Ser: 1.08 mg/dL (ref 0.61–1.24)
GFR calc Af Amer: >60 mL/min (ref >60)
GFR calc non Af Amer: >60 mL/min (ref >60)
Glucose, Bld: 262 mg/dL — ABNORMAL HIGH (ref 70–99)
POTASSIUM: 3.9 mmol/L (ref 3.5–5.1)
Sodium: 137 mmol/L (ref 135–145)

## 2017-10-11 LAB — HIV ANTIBODY (ROUTINE TESTING W REFLEX): HIV Screen 4th Generation wRfx: NONREACTIVE

## 2017-10-11 LAB — CBC
HCT: 45.1 % (ref 39.0–52.0)
HEMOGLOBIN: 14.7 g/dL (ref 13.0–17.0)
MCH: 30.1 pg (ref 26.0–34.0)
MCHC: 32.6 g/dL (ref 30.0–36.0)
MCV: 92.2 fL (ref 78.0–100.0)
PLATELETS: 228 10*3/uL (ref 150–400)
RBC: 4.89 MIL/uL (ref 4.22–5.81)
RDW: 14.1 % (ref 11.5–15.5)
WBC: 10.3 10*3/uL (ref 4.0–10.5)

## 2017-10-11 LAB — MAGNESIUM: Magnesium: 2.2 mg/dL (ref 1.7–2.4)

## 2017-10-11 MED ORDER — DOCUSATE SODIUM 100 MG PO CAPS: 100.0000 mg | ORAL_CAPSULE | Freq: Two times a day (BID) | ORAL | 0 refills | Status: AC

## 2017-10-11 MED ORDER — IBUPROFEN 200 MG PO TABS: 200.0000 mg | ORAL_TABLET | Freq: Four times a day (QID) | ORAL | Status: AC | PRN

## 2017-10-11 MED ORDER — TRAMADOL HCL 50 MG PO TABS: 50.0000 mg | ORAL_TABLET | Freq: Four times a day (QID) | ORAL | 0 refills | Status: AC | PRN

## 2017-10-11 MED ORDER — AMOXICILLIN-POT CLAVULANATE 875-125 MG PO TABS: 1.0000 | ORAL_TABLET | Freq: Two times a day (BID) | ORAL | 0 refills | Status: AC

## 2017-10-11 MED ORDER — ACETAMINOPHEN 325 MG PO TABS: 650.0000 mg | ORAL_TABLET | Freq: Four times a day (QID) | ORAL | 2 refills | Status: AC | PRN

## 2017-10-11 NOTE — Progress Notes (Signed)
Pt was discharged home today. Instructions were reviewed with patient, and questions were answered. Pt was taken to main entrance via wheelchair by NT.  

## 2017-10-11 NOTE — Discharge Summary (Signed)
Physician Discharge Summary  Patient ID: John Best MRN: [id number] DOB/AGE: 60-Feb-1957 60 y.o.  Admit date: 10/10/2017 Discharge date: 10/11/2017  Admission Diagnoses:  Discharge Diagnoses:  Active Problems:   Acute appendicitis   Appendicitis   Discharged Condition: good  Hospital Course: Admitted for observation following laparoscopic appendectomy. He was able to tolerate a diet, urinate, have a bowel movement and ambulate unassisted by the morning of post-op day 1.   Consults: None  Significant Diagnostic Studies: labs and CT see epic  Treatments: surgery: laparoscopic appendectomy  Discharge Exam: Blood pressure 137/73, pulse 76, temperature 99.1 F (37.3 C), temperature source Oral, resp. rate 16, height 5\' 7"  (1.702 m), weight 90.9 kg, SpO2 94 %. General appearance: alert and cooperative Resp: clear to auscultation bilaterally GI: soft, nondistended, appropriately tender at incisions which are c/d/i without infection or hernia. Dermabond.  Skin: Skin color, texture, turgor normal. No rashes or lesions  Disposition: Discharge disposition: 01-Home or Self Care       Discharge Instructions    Call MD for:  persistant nausea and vomiting   Complete by:  As directed    Call MD for:  redness, tenderness, or signs of infection (pain, swelling, redness, odor or green/yellow discharge around incision site)   Complete by:  As directed    Call MD for:  severe uncontrolled pain   Complete by:  As directed    Call MD for:  temperature >100.4   Complete by:  As directed    Diet - low sodium heart healthy   Complete by:  As directed    Increase activity slowly   Complete by:  As directed      Allergies as of 10/11/2017   No Known Allergies     Medication List    TAKE these medications   acetaminophen 325 MG tablet Commonly known as:  TYLENOL Take 2 tablets (650 mg total) by mouth every 6 (six) hours as needed.   amLODipine 5 MG tablet Commonly known as:   NORVASC TAKE 1 TABLET ONCE DAILY.   amoxicillin-clavulanate 875-125 MG tablet Commonly known as:  AUGMENTIN Take 1 tablet by mouth 2 (two) times daily for 5 days.   docusate sodium 100 MG capsule Commonly known as:  COLACE Take 1 capsule (100 mg total) by mouth 2 (two) times daily.   ibuprofen 200 MG tablet Commonly known as:  ADVIL,MOTRIN Take 1-2 tablets (200-400 mg total) by mouth every 6 (six) hours as needed.   methylphenidate 20 MG 24 hr capsule Commonly known as:  RITALIN LA TAKE 1 CAPSULE EACH MORNING.   multivitamin tablet Take 1 tablet by mouth daily.   traMADol 50 MG tablet Commonly known as:  ULTRAM Take 1 tablet (50 mg total) by mouth every 6 (six) hours as needed (mild pain).   valsartan-hydrochlorothiazide 160-12.5 MG tablet Commonly known as:  DIOVAN-HCT Take 1 tablet by mouth daily.      Follow-up Information    Burchette, Alinda Sierras, MD.   Specialty:  Family Medicine Contact information: [place] [address] [phone]        Surgery, [place]. Go on 10/17/2017.   Specialty:  General Surgery Why:  Appointment scheduled for 10:00 AM. Please arrive 30 min prior to appointment time. Bring photo ID and insurance information. Contact information: [address] [place]  [place] [address] [phone]           Signed: Clovis Riley 10/11/2017, 8:42 AM

## 2017-10-15 ENCOUNTER — Telehealth: Payer: Self-pay | Admitting: Family Medicine

## 2017-10-15 NOTE — Telephone Encounter (Signed)
I left a voice message for pt to return my call.  

## 2017-10-15 NOTE — Telephone Encounter (Signed)
Transition Care Management Follow-up Telephone Call  John Best MRN: [id number] DOB/AGE: [birth year] 62 y.o.  Admit date: 10/10/2017 Discharge date: 10/11/2017  Admission Diagnoses:  Discharge Diagnoses:  Active Problems:   Acute appendicitis   Appendicitis   Discharged Condition: good  Hospital Course: Admitted for observation following laparoscopic appendectomy. He was able to tolerate a diet, urinate, have a bowel movement and ambulate unassisted by the morning of post-op day 1.   Consults: None  Significant Diagnostic Studies: labs and CT see epic  Treatments: surgery: laparoscopic appendectomy  Discharge Exam: Blood pressure 137/73, pulse 76, temperature 99.1 F (37.3 C), temperature source Oral, resp. rate 16, height 5\' 7"  (1.702 m), weight 90.9 kg, SpO2 94 %. General appearance: alert and cooperative Resp: clear to auscultation bilaterally GI: soft, nondistended, appropriately tender at incisions which are c/d/i without infection or hernia. Dermabond.  Skin: Skin color, texture, turgor normal. No rashes or lesions    How have you been since you were released from the hospital? "doing good"   Do you understand why you were in the hospital? yes   Do you understand the discharge instructions? yes   Where were you discharged to? Home  Items Reviewed:  Medications reviewed: yes  Allergies reviewed: yes  Dietary changes reviewed: yes  Referrals reviewed: yes   Functional Questionnaire:   Activities of Daily Living (ADLs):   He states they are independent in the following: ambulation, bathing and hygiene, feeding, continence, grooming, toileting and dressing States they require assistance with the following: none   Any transportation issues/concerns?: no   Any patient concerns? no   Confirmed importance and date/time of follow-up visits scheduled yes  Provider Appointment booked with not at this time, he is following up with general  surgery.  Confirmed with patient if condition begins to worsen call PCP or go to the ER.  Patient was given the office number and encouraged to call back with question or concerns.  : yes

## 2017-10-16 ENCOUNTER — Other Ambulatory Visit: Payer: Self-pay | Admitting: *Deleted

## 2017-10-16 MED ORDER — VALSARTAN-HYDROCHLOROTHIAZIDE 160-12.5 MG PO TABS: 1.0000 | ORAL_TABLET | Freq: Every day | ORAL | 1 refills | Status: DC

## 2017-11-16 ENCOUNTER — Other Ambulatory Visit: Payer: Self-pay | Admitting: Family Medicine

## 2017-11-18 ENCOUNTER — Ambulatory Visit (INDEPENDENT_AMBULATORY_CARE_PROVIDER_SITE_OTHER): Payer: Managed Care, Other (non HMO) | Admitting: *Deleted

## 2017-11-18 ENCOUNTER — Encounter: Payer: Self-pay | Admitting: Family Medicine

## 2017-11-18 DIAGNOSIS — Z23 Encounter for immunization: Secondary | ICD-10-CM

## 2017-11-18 NOTE — Progress Notes (Signed)
Per orders of Dr. Elease Hashimoto, injection of Shingrix and Influenza given by Westley Hummer. Patient tolerated injection well.

## 2017-12-31 ENCOUNTER — Other Ambulatory Visit: Payer: Self-pay | Admitting: Family Medicine

## 2017-12-31 NOTE — Telephone Encounter (Signed)
Sent electronically 

## 2017-12-31 NOTE — Telephone Encounter (Signed)
Last OV 06/05/17, No future OV  Last filled 09/02/17, # 30 with 0 refills

## 2018-01-01 ENCOUNTER — Other Ambulatory Visit: Payer: Self-pay

## 2018-01-01 ENCOUNTER — Telehealth: Payer: Self-pay

## 2018-01-01 MED ORDER — HYDROCHLOROTHIAZIDE 12.5 MG PO CAPS: 12.5000 mg | ORAL_CAPSULE | Freq: Every day | ORAL | 1 refills | Status: DC

## 2018-01-01 MED ORDER — VALSARTAN 160 MG PO TABS: 160.0000 mg | ORAL_TABLET | Freq: Every day | ORAL | 1 refills | Status: DC

## 2018-01-01 NOTE — Telephone Encounter (Signed)
Sent separate prescriptions to [facility] for refill.

## 2018-01-01 NOTE — Telephone Encounter (Signed)
Copied from [facility] [phone]. Topic: General - Other >> Jan 01, 2018  2:24 PM Carolyn Stare wrote:  [facility] call to say thie following med is not available and not sure when it will be  valsartan-hydrochlorothiazide (DIOVAN HCT) 160-12.5 MG tablet   Asking if they can spilt the med or do the doctor want to change to something else

## 2018-01-01 NOTE — Telephone Encounter (Signed)
Please advise if okay to send separate?

## 2018-01-01 NOTE — Telephone Encounter (Signed)
OK to separate.

## 2018-01-27 ENCOUNTER — Ambulatory Visit (INDEPENDENT_AMBULATORY_CARE_PROVIDER_SITE_OTHER): Payer: Managed Care, Other (non HMO) | Admitting: Orthopedic Surgery

## 2018-01-27 ENCOUNTER — Encounter (INDEPENDENT_AMBULATORY_CARE_PROVIDER_SITE_OTHER): Payer: Self-pay | Admitting: Physician Assistant

## 2018-01-27 ENCOUNTER — Ambulatory Visit (INDEPENDENT_AMBULATORY_CARE_PROVIDER_SITE_OTHER): Payer: Managed Care, Other (non HMO)

## 2018-01-27 ENCOUNTER — Ambulatory Visit (INDEPENDENT_AMBULATORY_CARE_PROVIDER_SITE_OTHER): Payer: Self-pay | Admitting: Orthopedic Surgery

## 2018-01-27 VITALS — Ht 67.0 in | Wt 200.0 lb

## 2018-01-27 DIAGNOSIS — M25561 Pain in right knee: Secondary | ICD-10-CM | POA: Diagnosis not present

## 2018-01-27 MED ORDER — METHYLPREDNISOLONE ACETATE 40 MG/ML IJ SUSP
40.0000 mg | INTRAMUSCULAR | Status: AC | PRN
  Administered 2018-01-27: 40 mg via INTRA_ARTICULAR

## 2018-01-27 MED ORDER — LIDOCAINE HCL 1 % IJ SOLN
5.0000 mL | INTRAMUSCULAR | Status: AC | PRN
  Administered 2018-01-27: 5 mL

## 2018-01-27 NOTE — Progress Notes (Signed)
Office Visit Note   Patient: John Best           Date of Birth: 1955-05-06           MRN: [id number] Visit Date: 01/27/2018              Requested by: Eulas Post, MD [place], [place] [address] PCP: Eulas Post, MD  Chief Complaint  Patient presents with  . Right Knee - Pain      HPI: Patient is a 62 year old gentleman who complains of right knee pain and swelling for the past 10 days.  Patient states he had acute onset of pain when going down stairs.  States he has a clicking sound in his knee.  He states that his knee feels tight.  Assessment & Plan: Visit Diagnoses:  1. Acute pain of right knee     Plan: Knee was injected he tolerated this well recommended quad VMO strengthening with closed chain kinetic exercises.  Patient states he understands will follow-up as needed.  Follow-Up Instructions: Return if symptoms worsen or fail to improve.   Ortho Exam  Patient is alert, oriented, no adenopathy, well-dressed, normal affect, normal respiratory effort. Examination patient has crepitation of the patellofemoral joint with range of motion there is a very minimal effusion.  He has full extension flexion to 130 degrees.  The medial and lateral joint lines are nontender to palpation collaterals and cruciates are stable.  Patient has mild tenderness to palpation of the patellofemoral joint.  Imaging: Xr Knee 1-2 Views Right  Result Date: 01/27/2018 2 view radiographs of the right knee show some mild bony spurs in the patellofemoral joint.  There is no joint space narrowing no subcondylar cysts or sclerosis in the medial and lateral joint lines.  No images are attached to the encounter.  Labs: Lab Results  Component Value Date   HGBA1C 6.0 06/05/2017   HGBA1C 5.9 11/28/2015   ESRSEDRATE 9 06/10/2014   LABURIC 5.6 06/10/2014   REPTSTATUS 02/09/2011 FINAL 02/06/2011   CULT NO STAPHYLOCOCCUS AUREUS ISOLATED 02/06/2011     Lab  Results  Component Value Date   ALBUMIN 4.3 10/10/2017   ALBUMIN 4.1 06/05/2017   ALBUMIN 4.2 05/28/2016   LABURIC 5.6 06/10/2014    Body mass index is 31.32 kg/m.  Orders:  Orders Placed This Encounter  Procedures  . XR Knee 1-2 Views Right   No orders of the defined types were placed in this encounter.    Procedures: Large Joint Inj: R knee on 01/27/2018 2:08 PM Indications: pain and diagnostic evaluation Details: 22 G 1.5 in needle, anteromedial approach  Arthrogram: No  Medications: 5 mL lidocaine 1 %; 40 mg methylPREDNISolone acetate 40 MG/ML Outcome: tolerated well, no immediate complications Procedure, treatment alternatives, risks and benefits explained, specific risks discussed. Consent was given by the patient. Immediately prior to procedure a time out was called to verify the correct patient, procedure, equipment, support staff and site/side marked as required. Patient was prepped and draped in the usual sterile fashion.      Clinical Data: No additional findings.  ROS:  All other systems negative, except as noted in the HPI. Review of Systems  Objective: Vital Signs: Ht 5\' 7"  (1.702 m)   Wt 200 lb (90.7 kg)   BMI 31.32 kg/m   Specialty Comments:  No specialty comments available.  PMFS History: Patient Active Problem List   Diagnosis Date Noted  . Acute appendicitis 10/10/2017  .  Appendicitis 10/10/2017  . Prediabetes 06/04/2016  . Nicotine use disorder 05/30/2015  . Obesity (BMI 30-39.9) 12/08/2012  . ADD (attention deficit disorder) 05/16/2011  . Hypertension 05/16/2011  . Hyperlipidemia 05/16/2011  . Migraine 05/16/2011   Past Medical History:  Diagnosis Date  . Arthritis   . Depression   . Headache(784.0)   . Hyperlipidemia   . Hypertension     Family History  Problem Relation Age of Onset  . Hyperlipidemia Mother   . Heart disease Father 57       CAD  . Hyperlipidemia Father   . Hypertension Father   . Diabetes Brother         type ll  . Cancer Paternal Grandfather        colon cancer    Past Surgical History:  Procedure Laterality Date  . LAPAROSCOPIC APPENDECTOMY N/A 10/10/2017   Procedure: APPENDECTOMY LAPAROSCOPIC;  Surgeon: Clovis Riley, MD;  Location: [facility] ORS;  Service: General;  Laterality: N/A;  . OTHER SURGICAL HISTORY     bilateral thumb fusions   . SHOULDER SURGERY  2013  . THUMB FUSION  2005   Social History   Occupational History  . Not on file  Tobacco Use  . Smoking status: Current Every Day Smoker    Packs/day: 1.00    Years: 30.00    Pack years: 30.00    Types: Cigarettes  . Smokeless tobacco: Never Used  Substance and Sexual Activity  . Alcohol use: Yes    Comment: 14 drinks/wk drinks combination of all   . Drug use: No  . Sexual activity: Not on file

## 2018-02-03 ENCOUNTER — Encounter (INDEPENDENT_AMBULATORY_CARE_PROVIDER_SITE_OTHER): Payer: Self-pay | Admitting: Orthopedic Surgery

## 2018-02-04 ENCOUNTER — Other Ambulatory Visit: Payer: Self-pay | Admitting: Family Medicine

## 2018-02-04 ENCOUNTER — Encounter (INDEPENDENT_AMBULATORY_CARE_PROVIDER_SITE_OTHER): Payer: Self-pay | Admitting: Orthopedic Surgery

## 2018-02-04 ENCOUNTER — Ambulatory Visit (INDEPENDENT_AMBULATORY_CARE_PROVIDER_SITE_OTHER): Payer: Managed Care, Other (non HMO) | Admitting: Orthopedic Surgery

## 2018-02-04 VITALS — Ht 67.0 in | Wt 200.0 lb

## 2018-02-04 DIAGNOSIS — G8929 Other chronic pain: Secondary | ICD-10-CM | POA: Diagnosis not present

## 2018-02-04 DIAGNOSIS — M23321 Other meniscus derangements, posterior horn of medial meniscus, right knee: Secondary | ICD-10-CM | POA: Diagnosis not present

## 2018-02-04 DIAGNOSIS — M25561 Pain in right knee: Secondary | ICD-10-CM | POA: Diagnosis not present

## 2018-02-04 NOTE — Telephone Encounter (Signed)
Last OV 06/05/17, No future OV  Last filled 12/31/17, # 30 with 0 refills

## 2018-02-04 NOTE — Progress Notes (Signed)
Office Visit Note   Patient: John Best           Date of Birth: 1955-11-10           MRN: [id number] Visit Date: 02/04/2018              Requested by: Eulas Post, MD [place], [place] [address] PCP: Eulas Post, MD  Chief Complaint  Patient presents with  . Right Knee - Follow-up    S/p injection 01/27/18      HPI: Patient is a 62 year old gentleman who presents with persistent pain medial joint line right knee.  Patient states the steroid injection provided about 2 days relief he states the swelling has reoccurred and he has mechanical symptoms on the medial joint line.  He states he feels like he is being stabbed with a hot poker.  Assessment & Plan: Visit Diagnoses:  1. Other meniscus derangements, posterior horn of medial meniscus, right knee     Plan: We will request an MRI scan of the right knee to evaluate for medial meniscal pathology.  Plan to follow-up after the MRI scan is obtained.  Follow-Up Instructions: Return if symptoms worsen or fail to improve.   Ortho Exam  Patient is alert, oriented, no adenopathy, well-dressed, normal affect, normal respiratory effort. Examination patient has crepitation with range of motion in the patellofemoral joint he has a mild effusion he is point tender to palpation over the medial joint line collaterals or cruciates are stable flexion and rotation reproduces medial joint line pain.  Imaging: No results found. No images are attached to the encounter.  Labs: Lab Results  Component Value Date   HGBA1C 6.0 06/05/2017   HGBA1C 5.9 11/28/2015   ESRSEDRATE 9 06/10/2014   LABURIC 5.6 06/10/2014   REPTSTATUS 02/09/2011 FINAL 02/06/2011   CULT NO STAPHYLOCOCCUS AUREUS ISOLATED 02/06/2011     Lab Results  Component Value Date   ALBUMIN 4.3 10/10/2017   ALBUMIN 4.1 06/05/2017   ALBUMIN 4.2 05/28/2016   LABURIC 5.6 06/10/2014    Body mass index is 31.32 kg/m.  Orders:  No orders  of the defined types were placed in this encounter.  No orders of the defined types were placed in this encounter.    Procedures: No procedures performed  Clinical Data: No additional findings.  ROS:  All other systems negative, except as noted in the HPI. Review of Systems  Objective: Vital Signs: Ht 5\' 7"  (1.702 m)   Wt 200 lb (90.7 kg)   BMI 31.32 kg/m   Specialty Comments:  No specialty comments available.  PMFS History: Patient Active Problem List   Diagnosis Date Noted  . Acute appendicitis 10/10/2017  . Appendicitis 10/10/2017  . Prediabetes 06/04/2016  . Nicotine use disorder 05/30/2015  . Obesity (BMI 30-39.9) 12/08/2012  . ADD (attention deficit disorder) 05/16/2011  . Hypertension 05/16/2011  . Hyperlipidemia 05/16/2011  . Migraine 05/16/2011   Past Medical History:  Diagnosis Date  . Arthritis   . Depression   . Headache(784.0)   . Hyperlipidemia   . Hypertension     Family History  Problem Relation Age of Onset  . Hyperlipidemia Mother   . Heart disease Father 64       CAD  . Hyperlipidemia Father   . Hypertension Father   . Diabetes Brother        type ll  . Cancer Paternal Grandfather        colon cancer  Past Surgical History:  Procedure Laterality Date  . LAPAROSCOPIC APPENDECTOMY N/A 10/10/2017   Procedure: APPENDECTOMY LAPAROSCOPIC;  Surgeon: Clovis Riley, MD;  Location: [facility];  Service: General;  Laterality: N/A;  . OTHER SURGICAL HISTORY     bilateral thumb fusions   . SHOULDER SURGERY  2013  . THUMB FUSION  2005   Social History   Occupational History  . Not on file  Tobacco Use  . Smoking status: Current Every Day Smoker    Packs/day: 1.00    Years: 30.00    Pack years: 30.00    Types: Cigarettes  . Smokeless tobacco: Never Used  Substance and Sexual Activity  . Alcohol use: Yes    Comment: 14 drinks/wk drinks combination of all   . Drug use: No  . Sexual activity: Not on file

## 2018-02-14 ENCOUNTER — Ambulatory Visit
Admission: RE | Admit: 2018-02-14 | Discharge: 2018-02-14 | Disposition: A | Discharge status: Home / Self Care | Payer: Managed Care, Other (non HMO) | Source: Ambulatory Visit | Attending: Orthopedic Surgery | Admitting: Orthopedic Surgery

## 2018-02-14 DIAGNOSIS — G8929 Other chronic pain: Secondary | ICD-10-CM

## 2018-02-14 DIAGNOSIS — M25561 Pain in right knee: Principal | ICD-10-CM

## 2018-02-24 ENCOUNTER — Encounter (INDEPENDENT_AMBULATORY_CARE_PROVIDER_SITE_OTHER): Payer: Self-pay | Admitting: Orthopedic Surgery

## 2018-02-24 ENCOUNTER — Ambulatory Visit (INDEPENDENT_AMBULATORY_CARE_PROVIDER_SITE_OTHER): Payer: Managed Care, Other (non HMO) | Admitting: Orthopedic Surgery

## 2018-02-24 VITALS — Ht 67.0 in | Wt 200.0 lb

## 2018-02-24 DIAGNOSIS — M23321 Other meniscus derangements, posterior horn of medial meniscus, right knee: Secondary | ICD-10-CM | POA: Diagnosis not present

## 2018-02-24 NOTE — Progress Notes (Signed)
Office Visit Note   Patient: John Best           Date of Birth: [birth year]           MRN: [id number] Visit Date: 02/24/2018              Requested by: Eulas Post, MD [place], [place] [address] PCP: Eulas Post, MD  Chief Complaint  Patient presents with  . Right Knee - Pain, Follow-up      HPI: Patient is a 63 year old gentleman who presents in follow-up status post MRI scan of the right knee.  Patient states that his knee is slowly getting better he is using a knee sleeve and icy hot at night which seems to make his knee feel better.  Assessment & Plan: Visit Diagnoses:  1. Other meniscus derangements, posterior horn of medial meniscus, right knee     Plan: Recommended VMO strengthening with quad isometric straight leg raises as well as closed chain kinetic exercises for strengthening.  Follow-Up Instructions: Return if symptoms worsen or fail to improve.   Ortho Exam  Patient is alert, oriented, no adenopathy, well-dressed, normal affect, normal respiratory effort. Examination patient has normal gait.  The right knee is not tender to palpation good range of motion no effusion.  Review of the MRI scan does show some arthritic changes in the patellofemoral joint as well as medial joint line there is some degeneration of the posterior horn of the medial meniscus without definite tear.  Imaging: No results found. No images are attached to the encounter.  Labs: Lab Results  Component Value Date   HGBA1C 6.0 06/05/2017   HGBA1C 5.9 11/28/2015   ESRSEDRATE 9 06/10/2014   LABURIC 5.6 06/10/2014   REPTSTATUS 02/09/2011 FINAL 02/06/2011   CULT NO STAPHYLOCOCCUS AUREUS ISOLATED 02/06/2011     Lab Results  Component Value Date   ALBUMIN 4.3 10/10/2017   ALBUMIN 4.1 06/05/2017   ALBUMIN 4.2 05/28/2016   LABURIC 5.6 06/10/2014    Body mass index is 31.32 kg/m.  Orders:  No orders of the defined types were placed in this  encounter.  No orders of the defined types were placed in this encounter.    Procedures: No procedures performed  Clinical Data: No additional findings.  ROS:  All other systems negative, except as noted in the HPI. Review of Systems  Objective: Vital Signs: Ht 5\' 7"  (1.702 m)   Wt 200 lb (90.7 kg)   BMI 31.32 kg/m   Specialty Comments:  No specialty comments available.  PMFS History: Patient Active Problem List   Diagnosis Date Noted  . Acute appendicitis 10/10/2017  . Appendicitis 10/10/2017  . Prediabetes 06/04/2016  . Nicotine use disorder 05/30/2015  . Obesity (BMI 30-39.9) 12/08/2012  . ADD (attention deficit disorder) 05/16/2011  . Hypertension 05/16/2011  . Hyperlipidemia 05/16/2011  . Migraine 05/16/2011   Past Medical History:  Diagnosis Date  . Arthritis   . Depression   . Headache(784.0)   . Hyperlipidemia   . Hypertension     Family History  Problem Relation Age of Onset  . Hyperlipidemia Mother   . Heart disease Father 73       CAD  . Hyperlipidemia Father   . Hypertension Father   . Diabetes Brother        type ll  . Cancer Paternal Grandfather        colon cancer    Past Surgical History:  Procedure Laterality Date  .  LAPAROSCOPIC APPENDECTOMY N/A 10/10/2017   Procedure: APPENDECTOMY LAPAROSCOPIC;  Surgeon: Clovis Riley, MD;  Location: [facility];  Service: General;  Laterality: N/A;  . OTHER SURGICAL HISTORY     bilateral thumb fusions   . SHOULDER SURGERY  2013  . THUMB FUSION  2005   Social History   Occupational History  . Not on file  Tobacco Use  . Smoking status: Current Every Day Smoker    Packs/day: 1.00    Years: 30.00    Pack years: 30.00    Types: Cigarettes  . Smokeless tobacco: Never Used  Substance and Sexual Activity  . Alcohol use: Yes    Comment: 14 drinks/wk drinks combination of all   . Drug use: No  . Sexual activity: Not on file

## 2018-03-17 ENCOUNTER — Other Ambulatory Visit: Payer: Self-pay | Admitting: Family Medicine

## 2018-03-17 NOTE — Telephone Encounter (Signed)
Last OV 06/05/17, No future OV  Last filled 02/04/18, # 30 with 0 refill

## 2018-03-18 MED ORDER — METHYLPHENIDATE HCL ER (LA) 20 MG PO CP24: ORAL_CAPSULE | ORAL | 0 refills | Status: DC

## 2018-03-18 NOTE — Telephone Encounter (Signed)
Refills given for 2 months.   He will need follow up for physical by April.

## 2018-05-20 ENCOUNTER — Other Ambulatory Visit: Payer: Self-pay | Admitting: Family Medicine

## 2018-05-20 NOTE — Telephone Encounter (Signed)
Given circumstances, refilled for one month.  Will need follow up once things settle down.

## 2018-05-20 NOTE — Telephone Encounter (Signed)
Refilled Amlodipine with note to schedule an appointment for further refills.  Last OV 06/05/17, No future OV  Last filled 03/18/18, # 30 with 1 refill

## 2018-05-28 ENCOUNTER — Other Ambulatory Visit: Payer: Self-pay | Admitting: Family Medicine

## 2018-05-28 ENCOUNTER — Encounter: Payer: Self-pay | Admitting: Family Medicine

## 2018-05-28 NOTE — Telephone Encounter (Signed)
Patient is due for physical this month. Do you want to do a Doxy appt to update until CPE can be scheduled? OK for PA for Methylphenidate LA 20 mg?

## 2018-05-28 NOTE — Telephone Encounter (Signed)
Requested medication (s) are due for refill today:yes  Requested medication (s) are on the active medication list: yes  Last refill:  03/18/18  Future visit scheduled: no  Notes to clinic:  Not delegated due for physical Not sure if we are scheduling them with the Stay at home orders.    Requested Prescriptions  Pending Prescriptions Disp Refills   methylphenidate (RITALIN LA) 20 MG 24 hr capsule 30 capsule 0     Not Delegated - Psychiatry:  Stimulants/ADHD Failed - 05/28/2018  2:16 PM      Failed - This refill cannot be delegated      Failed - Urine Drug Screen completed in last 360 days.      Failed - Valid encounter within last 3 months    Recent Outpatient Visits          11 months ago Physical exam   [place] at [facility], Alinda Sierras, MD   1 year ago Hypertension, unspecified type   [facility], Alinda Sierras, MD   1 year ago Physical exam   [facility], Alinda Sierras, MD   2 years ago Physical exam   [place] [facility], Alinda Sierras, MD   3 years ago Left foot pain   [place] [facility], Alinda Sierras, MD

## 2018-05-29 ENCOUNTER — Telehealth: Payer: Self-pay

## 2018-05-29 ENCOUNTER — Other Ambulatory Visit: Payer: Self-pay | Admitting: Family Medicine

## 2018-05-29 MED ORDER — METHYLPHENIDATE HCL ER (LA) 20 MG PO CP24: ORAL_CAPSULE | ORAL | 0 refills | Status: DC

## 2018-05-29 NOTE — Telephone Encounter (Signed)
What do they need for prior auth?

## 2018-05-29 NOTE — Telephone Encounter (Signed)
Thanks

## 2018-05-29 NOTE — Telephone Encounter (Signed)
I will send to [facility] to start. They usually fill for the year though

## 2018-05-29 NOTE — Telephone Encounter (Signed)
Note says this requires PA. OK to send start?

## 2018-05-29 NOTE — Telephone Encounter (Signed)
Can you please complete the PA for Methylphenidate (Ritalin LA) 20 mg for patient? Dr. Elease Hashimoto sent in a prescription and it came back stating that a PA was needed. Thank you!

## 2018-06-02 ENCOUNTER — Encounter: Payer: Self-pay | Admitting: Family Medicine

## 2018-06-02 NOTE — Telephone Encounter (Signed)
Prior auth for Methylphenidate LA 20mg  capsules sent to [email or web address] [id number].

## 2018-06-03 NOTE — Telephone Encounter (Signed)
Fax received from [place] stating the request was approved from 06/02/2018-4/132021.  I called [facility] and left a detailed message on the voicemail with this info.

## 2018-06-06 NOTE — Telephone Encounter (Signed)
This is approved now if you want to resend. Thank you!

## 2018-06-09 NOTE — Telephone Encounter (Signed)
sent 

## 2018-06-25 ENCOUNTER — Ambulatory Visit (INDEPENDENT_AMBULATORY_CARE_PROVIDER_SITE_OTHER): Payer: Managed Care, Other (non HMO) | Admitting: Family Medicine

## 2018-06-25 ENCOUNTER — Other Ambulatory Visit: Payer: Self-pay | Admitting: Family Medicine

## 2018-06-25 ENCOUNTER — Other Ambulatory Visit: Payer: Self-pay

## 2018-06-25 DIAGNOSIS — R7303 Prediabetes: Secondary | ICD-10-CM | POA: Diagnosis not present

## 2018-06-25 DIAGNOSIS — E785 Hyperlipidemia, unspecified: Secondary | ICD-10-CM | POA: Diagnosis not present

## 2018-06-25 DIAGNOSIS — I10 Essential (primary) hypertension: Secondary | ICD-10-CM

## 2018-06-25 DIAGNOSIS — F988 Other specified behavioral and emotional disorders with onset usually occurring in childhood and adolescence: Secondary | ICD-10-CM

## 2018-06-25 MED ORDER — METHYLPHENIDATE HCL ER (LA) 20 MG PO CP24: ORAL_CAPSULE | ORAL | 0 refills | Status: DC

## 2018-06-25 MED ORDER — HYDROCHLOROTHIAZIDE 12.5 MG PO CAPS: 12.5000 mg | ORAL_CAPSULE | Freq: Every day | ORAL | 3 refills | Status: DC

## 2018-06-25 MED ORDER — VALSARTAN 160 MG PO TABS: 160.0000 mg | ORAL_TABLET | Freq: Every day | ORAL | 3 refills | Status: DC

## 2018-06-25 MED ORDER — AMLODIPINE BESYLATE 5 MG PO TABS: 5.0000 mg | ORAL_TABLET | Freq: Every day | ORAL | 3 refills | Status: DC

## 2018-06-25 NOTE — Progress Notes (Signed)
This visit type was conducted due to national recommendations for restrictions regarding the COVID-19 pandemic in an effort to limit this patient's exposure and mitigate transmission in our community.   Virtual Visit via Video Note  I connected with John Best on 06/25/18 at  4:30 PM EDT by a video enabled telemedicine application and verified that I am speaking with the correct person using two identifiers.  Location patient: home Location provider:work or home office Persons participating in the virtual visit: patient, provider  I discussed the limitations of evaluation and management by telemedicine and the availability of in person appointments. The patient expressed understanding and agreed to proceed.   HPI: Medical follow-up.  Chronic problems include hypertension, hyperlipidemia, attention deficit disorder, prediabetes.  He had emergency appendectomy last summer for acute appendicitis.  No complications.  Current medications include amlodipine, valsartan hand, HCTZ, and Ritalin.  His blood pressure he states has been in the normal range recently.  No recent headaches or dizziness.  No chest pains.  He had lab work with physical about exactly a year ago that was relatively stable.  A1c was 6.0%.  No polyuria or polydipsia.   ROS: See pertinent positives and negatives per HPI.  Past Medical History:  Diagnosis Date  . Arthritis   . Depression   . Headache(784.0)   . Hyperlipidemia   . Hypertension     Past Surgical History:  Procedure Laterality Date  . LAPAROSCOPIC APPENDECTOMY N/A 10/10/2017   Procedure: APPENDECTOMY LAPAROSCOPIC;  Surgeon: Clovis Riley, MD;  Location: [facility];  Service: General;  Laterality: N/A;  . OTHER SURGICAL HISTORY     bilateral thumb fusions   . SHOULDER SURGERY  2013  . THUMB FUSION  2005    Family History  Problem Relation Age of Onset  . Hyperlipidemia Mother   . Heart disease Father 70       CAD  . Hyperlipidemia Father   .  Hypertension Father   . Diabetes Brother        type ll  . Cancer Paternal Grandfather        colon cancer    SOCIAL HX: History of smoking.   Current Outpatient Medications:  .  acetaminophen (TYLENOL) 325 MG tablet, Take 2 tablets (650 mg total) by mouth every 6 (six) hours as needed., Disp: 100 tablet, Rfl: 2 .  amLODipine (NORVASC) 5 MG tablet, Take 1 tablet (5 mg total) by mouth daily., Disp: 90 tablet, Rfl: 3 .  hydrochlorothiazide (MICROZIDE) 12.5 MG capsule, Take 1 capsule (12.5 mg total) by mouth daily., Disp: 90 capsule, Rfl: 3 .  ibuprofen (ADVIL,MOTRIN) 200 MG tablet, Take 1-2 tablets (200-400 mg total) by mouth every 6 (six) hours as needed., Disp: , Rfl:  .  methylphenidate (RITALIN LA) 20 MG 24 hr capsule, Take one capsule daily.  May refill in one month., Disp: 30 capsule, Rfl: 0 .  methylphenidate (RITALIN LA) 20 MG 24 hr capsule, Take one capsule by mouth each morning., Disp: 30 capsule, Rfl: 0 .  methylphenidate (RITALIN LA) 20 MG 24 hr capsule, TAKE 1 CAPSULE EACH MORNING. May refill in two months., Disp: 30 capsule, Rfl: 0 .  Multiple Vitamin (MULTIVITAMIN) tablet, Take 1 tablet by mouth daily., Disp: , Rfl:  .  traMADol (ULTRAM) 50 MG tablet, Take 1 tablet (50 mg total) by mouth every 6 (six) hours as needed (mild pain)., Disp: 20 tablet, Rfl: 0 .  valsartan (DIOVAN) 160 MG tablet, Take 1 tablet (160 mg total) by  mouth daily., Disp: 90 tablet, Rfl: 3  EXAM:  VITALS per patient if applicable:  GENERAL: alert, oriented, appears well and in no acute distress  HEENT: atraumatic, conjunttiva clear, no obvious abnormalities on inspection of external nose and ears  NECK: normal movements of the head and neck  LUNGS: on inspection no signs of respiratory distress, breathing rate appears normal, no obvious gross SOB, gasping or wheezing  CV: no obvious cyanosis  MS: moves all visible extremities without noticeable abnormality  PSYCH/NEURO: pleasant and cooperative,  no obvious depression or anxiety, speech and thought processing grossly intact  ASSESSMENT AND PLAN:  Discussed the following assessment and plan:  #1 hypertension-stable by home readings -Refill amlodipine, valsartan, and HCTZ  #2 attention deficit disorder -Refill Ritalin LA for 3 months  #3 history of prediabetes- -continue healthy lifestyle habits and reassess A1c at follow-up -We will schedule physical within the next 2 to 3 months     I discussed the assessment and treatment plan with the patient. The patient was provided an opportunity to ask questions and all were answered. The patient agreed with the plan and demonstrated an understanding of the instructions.   The patient was advised to call back or seek an in-person evaluation if the symptoms worsen or if the condition fails to improve as anticipated.     Carolann Littler, MD

## 2018-06-25 NOTE — Telephone Encounter (Signed)
Patient has an appointment today at 4:30 pm for Doxy visit

## 2018-07-01 ENCOUNTER — Telehealth: Payer: Self-pay | Admitting: *Deleted

## 2018-07-01 MED ORDER — METHYLPHENIDATE HCL ER (LA) 20 MG PO CP24: ORAL_CAPSULE | ORAL | 0 refills | Status: DC

## 2018-07-01 NOTE — Addendum Note (Signed)
Addended by: Eulas Post on: 07/01/2018 12:32 PM   Modules accepted: Orders

## 2018-07-01 NOTE — Telephone Encounter (Signed)
Please see message. °

## 2018-07-01 NOTE — Telephone Encounter (Signed)
Copied from [place] [phone]. Topic: General - Other >> Jul 01, 2018 11:03 AM Jodie Echevaria wrote: Reason for CRM: Janie with [facility] called to say that Rx for methylphenidate (RITALIN LA) 20 MG 24 hr capsule is not available at this Pharmacy but is at the [facility] Address: [address], [place], [place] [address] Phone: [phone]  she is asking can Dr Elease Hashimoto please send the Rx's there since the patient is in need of this today.

## 2018-07-29 ENCOUNTER — Encounter: Payer: Self-pay | Admitting: Family Medicine

## 2018-07-29 NOTE — Telephone Encounter (Signed)
Please advise.  Ritalin last filled 07/01/2018

## 2018-08-06 ENCOUNTER — Other Ambulatory Visit: Payer: Self-pay | Admitting: Family Medicine

## 2018-08-06 MED ORDER — METHYLPHENIDATE HCL ER (LA) 20 MG PO CP24: ORAL_CAPSULE | ORAL | 0 refills | Status: DC

## 2018-08-06 NOTE — Telephone Encounter (Signed)
These notes were very confusing.  Not sure whether he was requesting they be sent to [facility] here or in [place].  Some states will not accept out of state rx for stimulant medications.  I went ahead and sent to [facility] and we will see if they go through.

## 2018-08-12 ENCOUNTER — Other Ambulatory Visit: Payer: Self-pay | Admitting: *Deleted

## 2018-08-12 DIAGNOSIS — Z122 Encounter for screening for malignant neoplasm of respiratory organs: Secondary | ICD-10-CM

## 2018-08-12 DIAGNOSIS — F17210 Nicotine dependence, cigarettes, uncomplicated: Secondary | ICD-10-CM

## 2018-09-03 ENCOUNTER — Inpatient Hospital Stay: Admission: RE | Admit: 2018-09-03 | Payer: Managed Care, Other (non HMO) | Source: Ambulatory Visit

## 2018-09-09 ENCOUNTER — Encounter: Payer: Self-pay | Admitting: Family Medicine

## 2018-09-10 MED ORDER — METHYLPHENIDATE HCL ER (LA) 20 MG PO CP24: ORAL_CAPSULE | ORAL | 0 refills | Status: DC

## 2018-09-10 NOTE — Telephone Encounter (Signed)
sent 

## 2018-09-16 ENCOUNTER — Encounter: Payer: Self-pay | Admitting: Family Medicine

## 2018-09-16 ENCOUNTER — Ambulatory Visit: Payer: Managed Care, Other (non HMO)

## 2018-10-15 ENCOUNTER — Encounter: Payer: Self-pay | Admitting: Family Medicine

## 2018-10-17 MED ORDER — METHYLPHENIDATE HCL ER (LA) 20 MG PO CP24: ORAL_CAPSULE | ORAL | 0 refills | Status: DC

## 2018-10-17 NOTE — Telephone Encounter (Signed)
rx sent

## 2018-11-18 ENCOUNTER — Other Ambulatory Visit: Payer: Self-pay

## 2018-11-18 ENCOUNTER — Encounter: Payer: Self-pay | Admitting: Family Medicine

## 2018-11-18 ENCOUNTER — Ambulatory Visit (INDEPENDENT_AMBULATORY_CARE_PROVIDER_SITE_OTHER): Payer: Managed Care, Other (non HMO) | Admitting: Family Medicine

## 2018-11-18 VITALS — BP 118/78 | HR 81 | Temp 97.9°F | Wt 202.8 lb

## 2018-11-18 DIAGNOSIS — Z0000 Encounter for general adult medical examination without abnormal findings: Secondary | ICD-10-CM

## 2018-11-18 DIAGNOSIS — Z23 Encounter for immunization: Secondary | ICD-10-CM

## 2018-11-18 LAB — CBC WITH DIFFERENTIAL/PLATELET
Basophils Absolute: 0 10*3/uL (ref 0.0–0.1)
Basophils Relative: 0.4 % (ref 0.0–3.0)
Eosinophils Absolute: 0.2 10*3/uL (ref 0.0–0.7)
Eosinophils Relative: 2.9 % (ref 0.0–5.0)
HCT: 52.4 % — ABNORMAL HIGH (ref 39.0–52.0)
Hemoglobin: 17.3 g/dL — ABNORMAL HIGH (ref 13.0–17.0)
Lymphocytes Relative: 24.6 % (ref 12.0–46.0)
Lymphs Abs: 2.1 10*3/uL (ref 0.7–4.0)
MCHC: 32.9 g/dL (ref 30.0–36.0)
MCV: 90.5 fl (ref 78.0–100.0)
Monocytes Absolute: 0.9 10*3/uL (ref 0.1–1.0)
Monocytes Relative: 10.9 % (ref 3.0–12.0)
Neutro Abs: 5.2 10*3/uL (ref 1.4–7.7)
Neutrophils Relative %: 61.2 % (ref 43.0–77.0)
Platelets: 286 10*3/uL (ref 150.0–400.0)
RBC: 5.79 Mil/uL (ref 4.22–5.81)
RDW: 13.7 % (ref 11.5–15.5)
WBC: 8.4 10*3/uL (ref 4.0–10.5)

## 2018-11-18 LAB — HEPATIC FUNCTION PANEL
ALT: 31 U/L (ref 0–53)
AST: 16 U/L (ref 0–37)
Albumin: 4.4 g/dL (ref 3.5–5.2)
Alkaline Phosphatase: 60 U/L (ref 39–117)
Bilirubin, Direct: 0.1 mg/dL (ref 0.0–0.3)
Total Bilirubin: 0.5 mg/dL (ref 0.2–1.2)
Total Protein: 7 g/dL (ref 6.0–8.3)

## 2018-11-18 LAB — LIPID PANEL
Cholesterol: 213 mg/dL — ABNORMAL HIGH (ref 0–200)
HDL: 49.7 mg/dL (ref >39.00)
NonHDL: 163.7
Total CHOL/HDL Ratio: 4
Triglycerides: 217 mg/dL — ABNORMAL HIGH (ref 0.0–149.0)
VLDL: 43.4 mg/dL — ABNORMAL HIGH (ref 0.0–40.0)

## 2018-11-18 LAB — LDL CHOLESTEROL, DIRECT: Direct LDL: 143 mg/dL

## 2018-11-18 LAB — BASIC METABOLIC PANEL
BUN: 16 mg/dL (ref 6–23)
CO2: 27 mEq/L (ref 19–32)
Calcium: 9.8 mg/dL (ref 8.4–10.5)
Chloride: 99 mEq/L (ref 96–112)
Creatinine, Ser: 0.92 mg/dL (ref 0.40–1.50)
GFR: 83.53 mL/min (ref >60.00)
Glucose, Bld: 93 mg/dL (ref 70–99)
Potassium: 4.2 mEq/L (ref 3.5–5.1)
Sodium: 136 mEq/L (ref 135–145)

## 2018-11-18 LAB — PSA: PSA: 0.93 ng/mL (ref 0.10–4.00)

## 2018-11-18 LAB — TSH: TSH: 3.25 u[IU]/mL (ref 0.35–4.50)

## 2018-11-18 MED ORDER — CHANTIX STARTING MONTH PAK 0.5 MG X 11 & 1 MG X 42 PO TABS: ORAL_TABLET | ORAL | 0 refills | Status: AC

## 2018-11-18 MED ORDER — METHYLPHENIDATE HCL ER (LA) 20 MG PO CP24: ORAL_CAPSULE | ORAL | 0 refills | Status: DC

## 2018-11-18 NOTE — Progress Notes (Addendum)
Subjective:     Patient ID: John Best, male   DOB: 03-Jan-1956, 63 y.o.   MRN: [id number]  HPI Patient is seen for physical exam.  He has history of hypertension, history of migraine headaches, attention deficit disorder, hyperlipidemia, ongoing nicotine use.  He is still smoking about 1/2 pack cigarettes per day.  He is currently in enrolled in the [place] lung cancer screening program.  He does have desire to quit and would specifically like to consider Chantix.    He needs flu vaccine.  Has had prior Pneumovax.  He still has also had prior shingles vaccine.  Colonoscopy up-to-date.  Wt Readings from Last 3 Encounters:  11/18/18 202 lb 12.8 oz (92 kg)  02/24/18 200 lb (90.7 kg)  02/04/18 200 lb (90.7 kg)   The 10-year ASCVD risk score Mikey Bussing DC Jr., et al., 2013) is: 15.4%   Values used to calculate the score:     Age: 47 years     Sex: Male     Is Non-Hispanic African American: No     Diabetic: No     Tobacco smoker: Yes     Systolic Blood Pressure: 123456 mmHg     Is BP treated: Yes     HDL Cholesterol: 49.7 mg/dL     Total Cholesterol: 213 mg/dL   Past Medical History:  Diagnosis Date  . Arthritis   . Depression   . Headache(784.0)   . Hyperlipidemia   . Hypertension    Past Surgical History:  Procedure Laterality Date  . LAPAROSCOPIC APPENDECTOMY N/A 10/10/2017   Procedure: APPENDECTOMY LAPAROSCOPIC;  Surgeon: Clovis Riley, MD;  Location: [facility];  Service: General;  Laterality: N/A;  . OTHER SURGICAL HISTORY     bilateral thumb fusions   . SHOULDER SURGERY  2013  . THUMB FUSION  2005    reports that he has been smoking cigarettes. He has a 30.00 pack-year smoking history. He has never used smokeless tobacco. He reports current alcohol use. He reports that he does not use drugs. family history includes Cancer in his paternal grandfather; Diabetes in his brother; Heart disease (age of onset: 61) in his father; Hyperlipidemia in his father and mother; Hypertension in  his father. No Known Allergies   Review of Systems  Constitutional: Negative for activity change, appetite change, fatigue, fever and unexpected weight change.  HENT: Negative for congestion, ear pain and trouble swallowing.   Eyes: Negative for pain and visual disturbance.  Respiratory: Negative for cough, shortness of breath and wheezing.   Cardiovascular: Negative for chest pain and palpitations.  Gastrointestinal: Negative for abdominal distention, abdominal pain, blood in stool, constipation, diarrhea, nausea, rectal pain and vomiting.  Endocrine: Negative for polydipsia and polyuria.  Genitourinary: Negative for dysuria, hematuria and testicular pain.  Musculoskeletal: Negative for arthralgias and joint swelling.  Skin: Negative for rash.  Neurological: Negative for dizziness, syncope and headaches.  Hematological: Negative for adenopathy.  Psychiatric/Behavioral: Negative for confusion and dysphoric mood.       Objective:   Physical Exam Constitutional:      General: He is not in acute distress.    Appearance: He is well-developed.  HENT:     Head: Normocephalic and atraumatic.     Right Ear: External ear normal.     Left Ear: External ear normal.  Eyes:     Conjunctiva/sclera: Conjunctivae normal.     Pupils: Pupils are equal, round, and reactive to light.  Neck:     Musculoskeletal: Normal  range of motion and neck supple.     Thyroid: No thyromegaly.  Cardiovascular:     Rate and Rhythm: Normal rate and regular rhythm.     Heart sounds: Normal heart sounds. No murmur.  Pulmonary:     Effort: No respiratory distress.     Breath sounds: No wheezing or rales.  Abdominal:     General: Bowel sounds are normal. There is no distension.     Palpations: Abdomen is soft. There is no mass.     Tenderness: There is no abdominal tenderness. There is no guarding or rebound.  Lymphadenopathy:     Cervical: No cervical adenopathy.  Skin:    Findings: No rash.  Neurological:      Mental Status: He is alert and oriented to person, place, and time.     Cranial Nerves: No cranial nerve deficit.     Deep Tendon Reflexes: Reflexes normal.        Assessment:     Physical exam.  Initial elevated blood pressure reading but repeat reading was at goal.  Ongoing nicotine use.  Discussed the following health maintenance issues    Plan:     -Flu vaccine given -Has had prior Pneumovax and will get repeat at 18 -He has had previous Shingrix vaccine -Smoking cessation discussed.  He would like to consider trial of Chantix.  He has taken once before without side effect -Continue low-dose CT lung cancer screening annually -Colonoscopy up-to-date -Obtain follow-up labs  Eulas Post MD [facility]

## 2018-12-03 ENCOUNTER — Inpatient Hospital Stay: Admission: RE | Admit: 2018-12-03 | Payer: Managed Care, Other (non HMO) | Source: Ambulatory Visit

## 2018-12-18 ENCOUNTER — Other Ambulatory Visit: Payer: Self-pay

## 2018-12-18 ENCOUNTER — Ambulatory Visit (INDEPENDENT_AMBULATORY_CARE_PROVIDER_SITE_OTHER)
Admission: RE | Admit: 2018-12-18 | Discharge: 2018-12-18 | Disposition: A | Discharge status: Home / Self Care | Payer: Managed Care, Other (non HMO) | Source: Ambulatory Visit | Attending: Acute Care | Admitting: Acute Care

## 2018-12-18 DIAGNOSIS — Z87891 Personal history of nicotine dependence: Secondary | ICD-10-CM | POA: Diagnosis not present

## 2018-12-18 DIAGNOSIS — F17210 Nicotine dependence, cigarettes, uncomplicated: Secondary | ICD-10-CM

## 2018-12-18 DIAGNOSIS — Z122 Encounter for screening for malignant neoplasm of respiratory organs: Secondary | ICD-10-CM

## 2018-12-22 ENCOUNTER — Other Ambulatory Visit: Payer: Self-pay | Admitting: *Deleted

## 2018-12-22 DIAGNOSIS — Z122 Encounter for screening for malignant neoplasm of respiratory organs: Secondary | ICD-10-CM

## 2018-12-22 DIAGNOSIS — F17210 Nicotine dependence, cigarettes, uncomplicated: Secondary | ICD-10-CM

## 2019-03-05 ENCOUNTER — Encounter: Payer: Self-pay | Admitting: Family Medicine

## 2019-03-06 MED ORDER — METHYLPHENIDATE HCL ER (LA) 20 MG PO CP24: ORAL_CAPSULE | ORAL | 0 refills | Status: DC

## 2019-06-16 ENCOUNTER — Encounter: Payer: Self-pay | Admitting: Family Medicine

## 2019-06-16 MED ORDER — METHYLPHENIDATE HCL ER (LA) 20 MG PO CP24: ORAL_CAPSULE | ORAL | 0 refills | Status: DC

## 2019-07-14 ENCOUNTER — Encounter: Payer: Self-pay | Admitting: Family Medicine

## 2019-07-15 MED ORDER — METHYLPHENIDATE HCL ER (LA) 20 MG PO CP24: ORAL_CAPSULE | ORAL | 0 refills | Status: DC

## 2019-08-31 ENCOUNTER — Encounter: Payer: Self-pay | Admitting: Family Medicine

## 2019-08-31 MED ORDER — METHYLPHENIDATE HCL ER (LA) 20 MG PO CP24: ORAL_CAPSULE | ORAL | 0 refills | Status: AC

## 2019-08-31 MED ORDER — METHYLPHENIDATE HCL ER (LA) 20 MG PO CP24: ORAL_CAPSULE | ORAL | 0 refills | Status: DC

## 2019-09-08 ENCOUNTER — Encounter: Payer: Self-pay | Admitting: Family Medicine

## 2019-09-08 MED ORDER — VALSARTAN-HYDROCHLOROTHIAZIDE 160-12.5 MG PO TABS: 1.0000 | ORAL_TABLET | Freq: Every day | ORAL | 0 refills | Status: DC

## 2019-09-08 MED ORDER — AMLODIPINE BESYLATE 5 MG PO TABS: 5.0000 mg | ORAL_TABLET | Freq: Every day | ORAL | 0 refills | Status: DC

## 2019-09-08 MED ORDER — VALSARTAN-HYDROCHLOROTHIAZIDE 160-12.5 MG PO TABS: 1.0000 | ORAL_TABLET | Freq: Every day | ORAL | 0 refills | Status: AC

## 2019-09-08 MED ORDER — AMLODIPINE BESYLATE 5 MG PO TABS: 5.0000 mg | ORAL_TABLET | Freq: Every day | ORAL | 0 refills | Status: AC

## 2019-09-08 NOTE — Addendum Note (Signed)
Addended by: Modena Morrow R on: 09/08/2019 01:59 PM   Modules accepted: Orders

## 2019-10-01 ENCOUNTER — Encounter: Payer: Self-pay | Admitting: Family Medicine

## 2019-10-06 ENCOUNTER — Encounter: Payer: Self-pay | Admitting: Family Medicine

## 2019-10-07 MED ORDER — METHYLPHENIDATE HCL ER (LA) 20 MG PO CP24: ORAL_CAPSULE | ORAL | 0 refills | Status: AC

## 2019-10-07 NOTE — Telephone Encounter (Signed)
Printed Rx as per his request.

## 2020-03-18 ENCOUNTER — Telehealth: Payer: Self-pay | Admitting: Acute Care

## 2020-03-18 NOTE — Telephone Encounter (Signed)
Pt had LDCT 12/23/19 through [facility] requirements.  Pt is planning on coming back to [facility] next year when his insurance changes.  Pt wanted to know how to get disk to our office for SG to view. I advised pt to drop off disk at our office, and we could have it scanned into his chart.  Pt expressed understanding.  Nothing further needed at this time- will close encounter.

## 2020-04-06 ENCOUNTER — Telehealth: Payer: Self-pay | Admitting: Acute Care

## 2020-04-06 ENCOUNTER — Ambulatory Visit
Admission: RE | Admit: 2020-04-06 | Discharge: 2020-04-06 | Disposition: A | Discharge status: Home / Self Care | Payer: Self-pay | Source: Ambulatory Visit | Attending: Acute Care | Admitting: Acute Care

## 2020-04-06 ENCOUNTER — Other Ambulatory Visit: Payer: Self-pay | Admitting: Acute Care

## 2020-04-06 DIAGNOSIS — F17200 Nicotine dependence, unspecified, uncomplicated: Secondary | ICD-10-CM

## 2020-04-06 NOTE — Telephone Encounter (Signed)
1. LUNG RADS CATEGORY: 2    MANAGEMENT RECOMMENDATIONS: Continue annual screening with low-dose CT in 12 months.   2. LUNG RADS CATEGORY: S  -Mild coronary artery calcifications  LUNG NODULES:  2 mm nodule within the left upper lobe (series 5/64).  2 mm nodule within the left upper lobe (series 5/113).  2 mm nodule left upper lobe (series 5/120).  5 mm part solid nodule in the right middle lobe (series 5/182).   COPD: Airway predominant.  Severity: Mild.   PLEURA: No pleural thickening oreffusion.   HEART: Heart size normal. No pericardial effusion.   CORONARY ARTERY CALCIFICATION: Mild.   MEDIASTINUM/HILUM/AXILLA: No adenopathy.   OTHER FINDINGS: Calcifications within the right hepatic lobe which may be secondary to sequela of prior granulomatous infection  Langley Gauss, these are the results of the low dose CT this patient had done at [facility] 12/2019. He will be due 12/2020. Can you call hima and see if he is going to do this through our program, or the one at [facility]? Or is he one of the patient's whose insurance only pays for a [place]? In which case we wuill need to schedule his scan 12/2020. Thanks so much

## 2020-08-14 IMAGING — CT CT CHEST LUNG CANCER SCREENING LOW DOSE W/O CM
2 of 3 series · 15 of 36 positions shown, 18 images · non-contrast
Comparison: Low-dose lung cancer screening chest CT 06/19/2017.

CLINICAL DATA: 63-year-old male current smoker with 46 pack year
history of smoking. Lung cancer screening examination.

EXAM:
CT CHEST WITHOUT CONTRAST LOW-DOSE FOR LUNG CANCER SCREENING
TECHNIQUE: Multidetector CT imaging of the chest was performed following the
standard protocol without IV contrast.

[Series 2: thorax 5.0 i31f 3 · axial · 0.80mm/px · z∈[-338,-68]mm · 12 of 64 slices shown, 15 images]
[im 5/64  mediastinal]
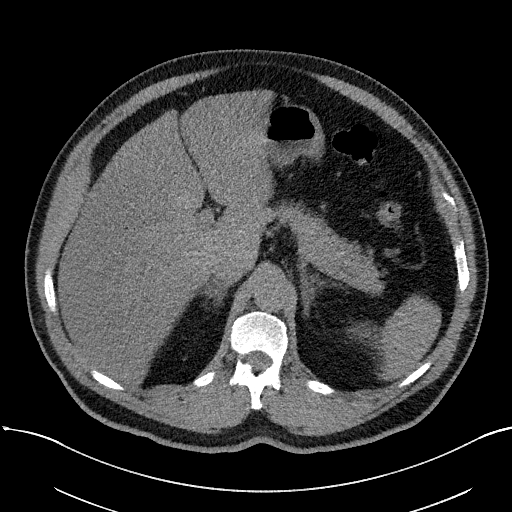
[im 5/64  lung]
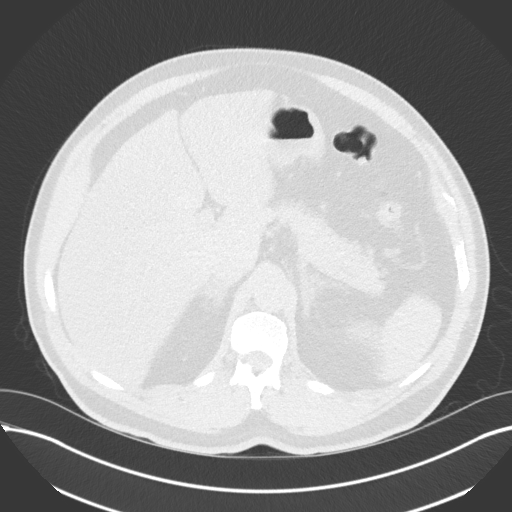
[im 10/64  lung]
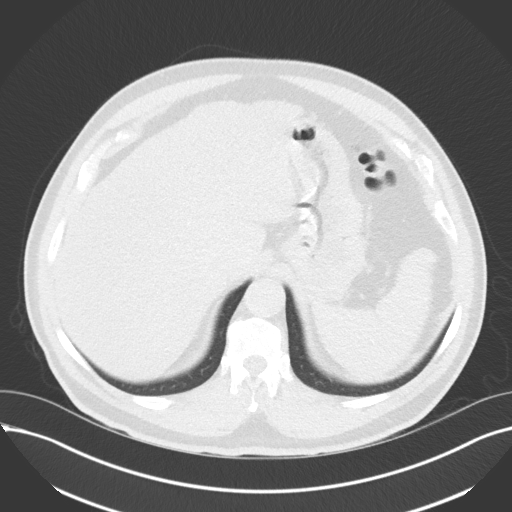
[im 15/64  lung]
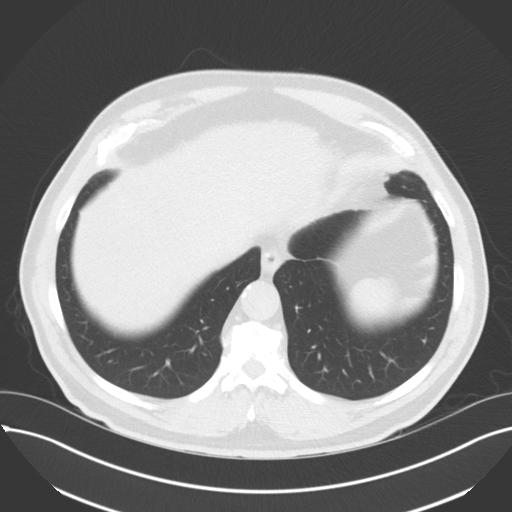
[im 19/64  lung]
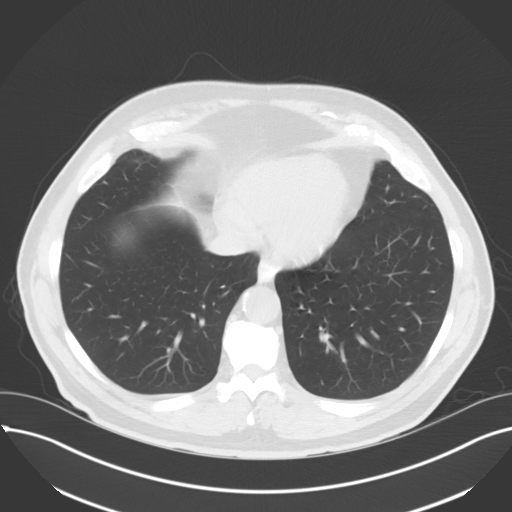
[im 24/64  mediastinal]
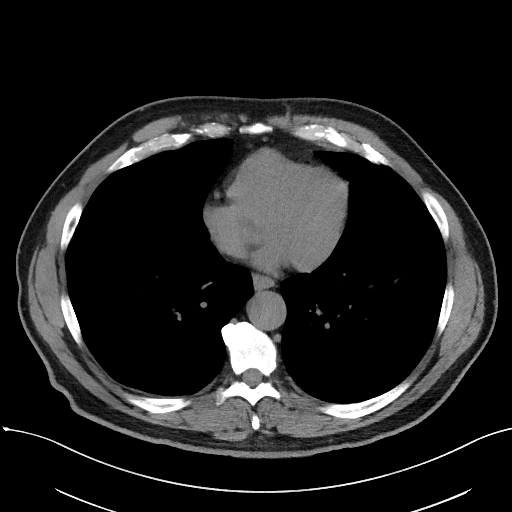
[im 24/64  lung]
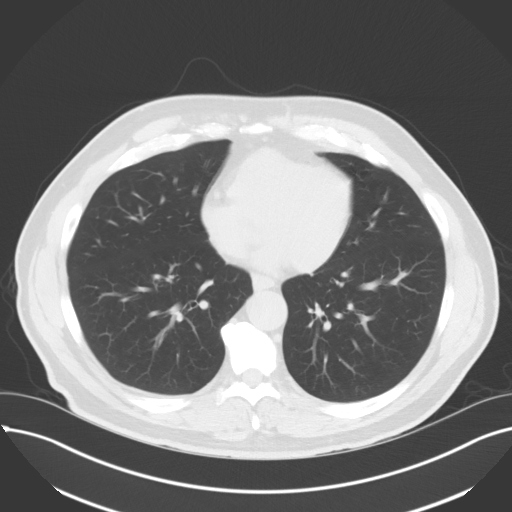
[im 29/64  lung]
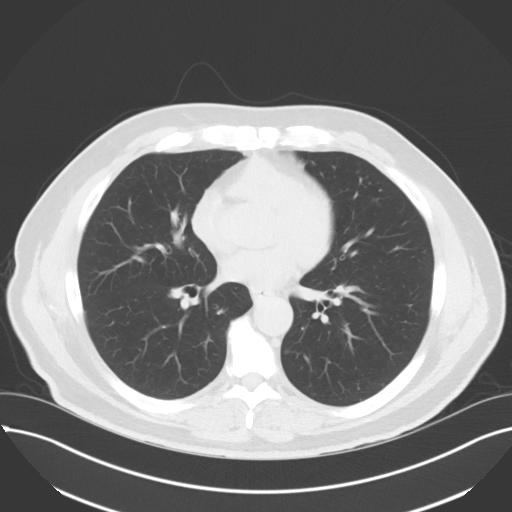
[im 36/64  lung]
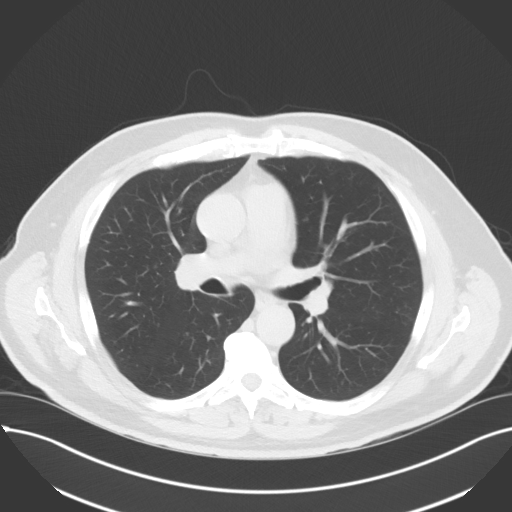
[im 40/64  lung]
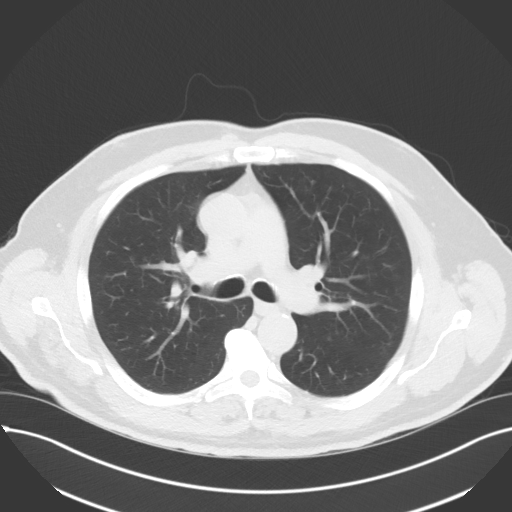
[im 45/64  mediastinal]
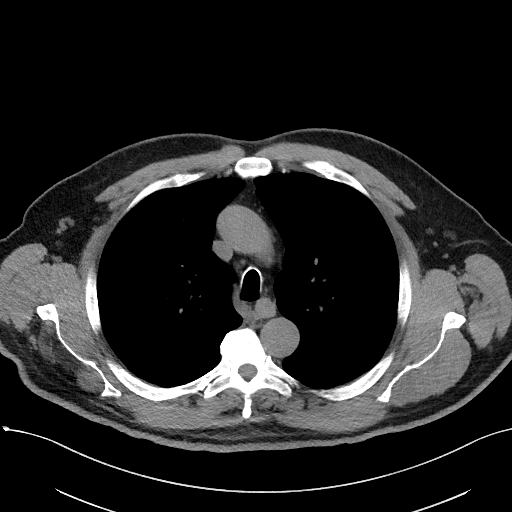
[im 45/64  lung]
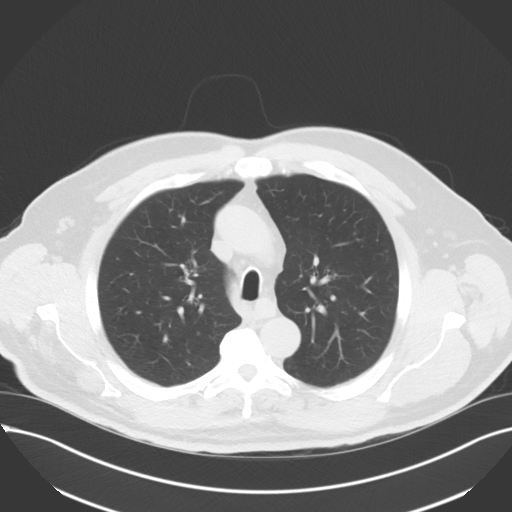
[im 50/64  lung]
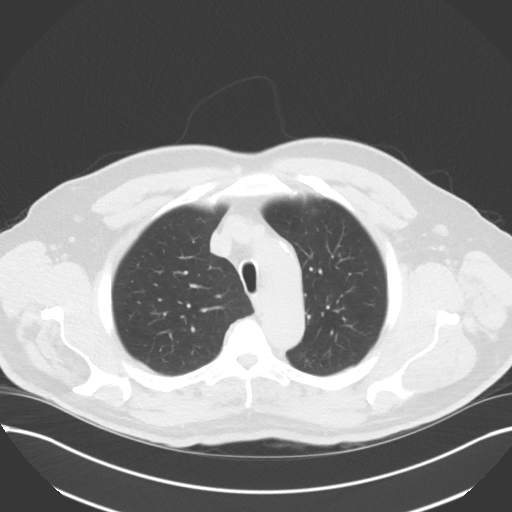
[im 54/64  lung]
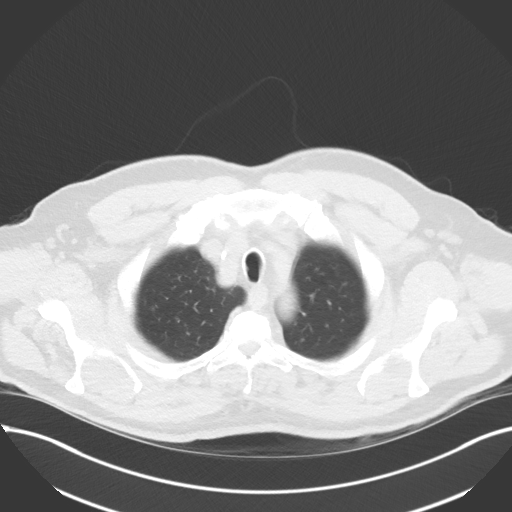
[im 59/64  lung]
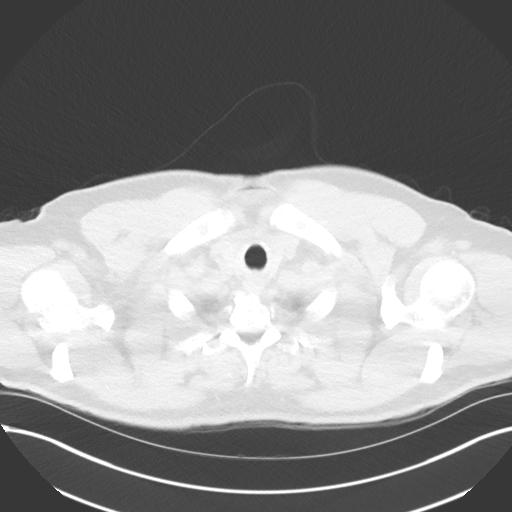

[Series 5: coronal · coronal · 0.62mm/px · 3 of 136 slices shown]
[im 28/136  lung]
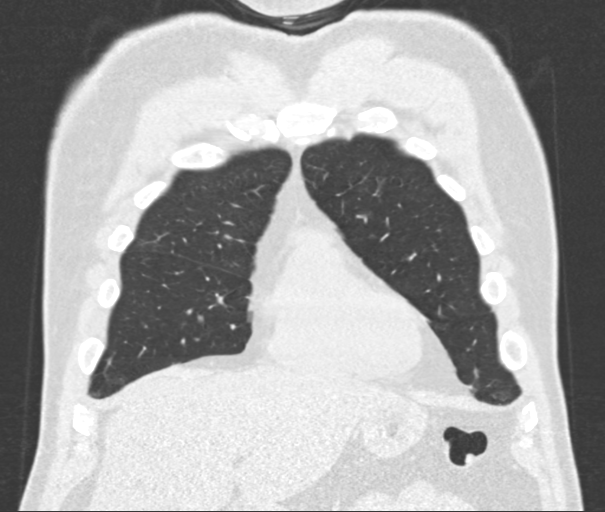
[im 55/136  lung]
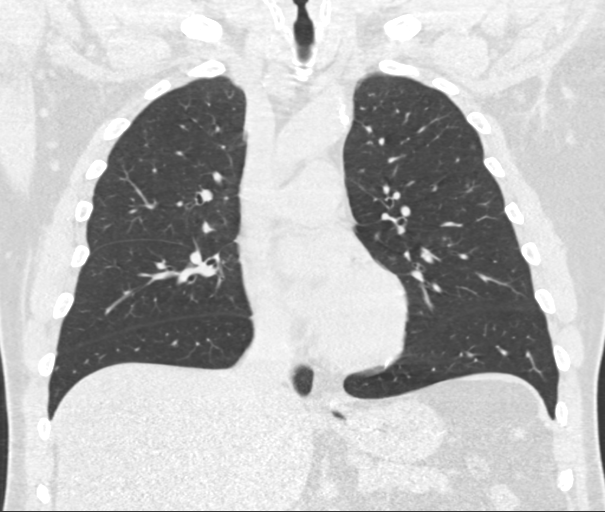
[im 82/136  lung]
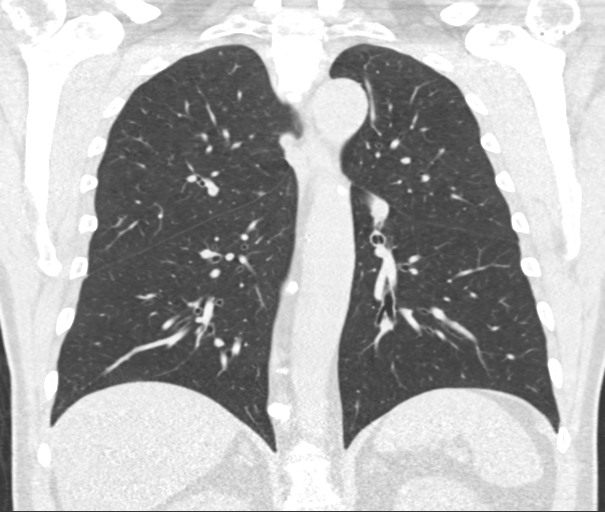

[15 of 36 positions shown; findings below may reference images not displayed]

FINDINGS: Cardiovascular: Heart size is normal. There is no significant
pericardial fluid, thickening or pericardial calcification. There is
aortic atherosclerosis, as well as atherosclerosis of the great
vessels of the mediastinum and the coronary arteries, including
calcified atherosclerotic plaque in the right coronary artery.

Mediastinum/Nodes: No pathologically enlarged mediastinal or hilar
lymph nodes. Please note that accurate exclusion of hilar adenopathy
is limited on noncontrast CT scans. Esophagus is unremarkable in
appearance. No axillary lymphadenopathy.

Lungs/Pleura: Multiple pulmonary nodules are noted in the lungs
bilaterally, largest of which is in the posterior aspect of the
right upper lobe (image 112, series 3) with a volume-derived mean
diameter of 3.9 mm. No other larger more suspicious appearing
pulmonary nodules or masses are noted. No acute consolidative
airspace disease. No pleural effusions. Mild diffuse bronchial wall
thickening with mild centrilobular and paraseptal emphysema.

Upper Abdomen: Diffuse low attenuation throughout the visualized
hepatic parenchyma, indicative of hepatic steatosis. Calcified
granuloma in the right lobe of the liver incidentally noted.

Musculoskeletal: There are no aggressive appearing lytic or blastic
lesions noted in the visualized portions of the skeleton.
IMPRESSION: 1. Lung-RADS 2S, benign appearance or behavior. Continue annual
screening with low-dose chest CT without contrast in 12 months.
2. The "S" modifier above refers to potentially clinically
significant non lung cancer related findings. Specifically, there is
aortic atherosclerosis, in addition to right coronary artery
disease. Please note that although the presence of coronary artery
calcium documents the presence of coronary artery disease, the
severity of this disease and any potential stenosis cannot be
assessed on this non-gated CT examination. Assessment for potential
risk factor modification, dietary therapy or pharmacologic therapy
may be warranted, if clinically indicated.
3. Mild diffuse bronchial wall thickening with mild centrilobular
and paraseptal emphysema; imaging findings suggestive of underlying
COPD.
4. Hepatic steatosis.

Aortic Atherosclerosis (9PEMT-MOT.T) and Emphysema (9PEMT-Z6E.E).

## 2021-05-08 ENCOUNTER — Telehealth: Payer: Self-pay | Admitting: Family Medicine

## 2021-05-08 NOTE — Telephone Encounter (Signed)
Lvm for patient to schedule a cpe due to lav being in 10/2018 ?

## 2022-02-18 ENCOUNTER — Emergency Department (HOSPITAL_COMMUNITY): Payer: BLUE CROSS/BLUE SHIELD

## 2022-02-18 ENCOUNTER — Encounter (HOSPITAL_COMMUNITY): Payer: Self-pay | Admitting: Neurology

## 2022-02-18 ENCOUNTER — Emergency Department (HOSPITAL_COMMUNITY)
Admission: EM | Admit: 2022-02-18 | Discharge: 2022-02-18 | Disposition: A | Discharge status: Home / Self Care | Payer: BLUE CROSS/BLUE SHIELD | Attending: Emergency Medicine | Admitting: Emergency Medicine

## 2022-02-18 DIAGNOSIS — R531 Weakness: Secondary | ICD-10-CM | POA: Insufficient documentation

## 2022-02-18 DIAGNOSIS — I10 Essential (primary) hypertension: Secondary | ICD-10-CM | POA: Diagnosis not present

## 2022-02-18 DIAGNOSIS — R270 Ataxia, unspecified: Secondary | ICD-10-CM | POA: Insufficient documentation

## 2022-02-18 DIAGNOSIS — F419 Anxiety disorder, unspecified: Secondary | ICD-10-CM | POA: Diagnosis not present

## 2022-02-18 DIAGNOSIS — I639 Cerebral infarction, unspecified: Secondary | ICD-10-CM | POA: Diagnosis not present

## 2022-02-18 DIAGNOSIS — Z79899 Other long term (current) drug therapy: Secondary | ICD-10-CM | POA: Diagnosis not present

## 2022-02-18 LAB — COMPREHENSIVE METABOLIC PANEL
ALT: 78 U/L — ABNORMAL HIGH (ref 0–44)
AST: 40 U/L (ref 15–41)
Albumin: 5 g/dL (ref 3.5–5.0)
Alkaline Phosphatase: 57 U/L (ref 38–126)
Anion gap: 10 (ref 5–15)
BUN: 17 mg/dL (ref 8–23)
CO2: 28 mmol/L (ref 22–32)
Calcium: 9.3 mg/dL (ref 8.9–10.3)
Chloride: 98 mmol/L (ref 98–111)
Creatinine, Ser: 1.25 mg/dL — ABNORMAL HIGH (ref 0.61–1.24)
GFR, Estimated: >60 mL/min (ref >60)
Glucose, Bld: 101 mg/dL — ABNORMAL HIGH (ref 70–99)
Potassium: 4.1 mmol/L (ref 3.5–5.1)
Sodium: 136 mmol/L (ref 135–145)
Total Bilirubin: 0.8 mg/dL (ref 0.3–1.2)
Total Protein: 7.7 g/dL (ref 6.5–8.1)

## 2022-02-18 LAB — APTT: aPTT: 27 seconds (ref 24–36)

## 2022-02-18 LAB — CBC
HCT: 54 % — ABNORMAL HIGH (ref 39.0–52.0)
Hemoglobin: 18 g/dL — ABNORMAL HIGH (ref 13.0–17.0)
MCH: 30.5 pg (ref 26.0–34.0)
MCHC: 33.3 g/dL (ref 30.0–36.0)
MCV: 91.4 fL (ref 80.0–100.0)
Platelets: 256 10*3/uL (ref 150–400)
RBC: 5.91 MIL/uL — ABNORMAL HIGH (ref 4.22–5.81)
RDW: 12.9 % (ref 11.5–15.5)
WBC: 8.4 10*3/uL (ref 4.0–10.5)
nRBC: 0 % (ref 0.0–0.2)

## 2022-02-18 LAB — DIFFERENTIAL
Abs Immature Granulocytes: 0.02 10*3/uL (ref 0.00–0.07)
Basophils Absolute: 0.1 10*3/uL (ref 0.0–0.1)
Basophils Relative: 1 %
Eosinophils Absolute: 0.4 10*3/uL (ref 0.0–0.5)
Eosinophils Relative: 5 %
Immature Granulocytes: 0 %
Lymphocytes Relative: 37 %
Lymphs Abs: 3.1 10*3/uL (ref 0.7–4.0)
Monocytes Absolute: 1 10*3/uL (ref 0.1–1.0)
Monocytes Relative: 12 %
Neutro Abs: 3.8 10*3/uL (ref 1.7–7.7)
Neutrophils Relative %: 45 %

## 2022-02-18 LAB — I-STAT CHEM 8, ED
BUN: 17 mg/dL (ref 8–23)
Calcium, Ion: 1.22 mmol/L (ref 1.15–1.40)
Chloride: 99 mmol/L (ref 98–111)
Creatinine, Ser: 1.1 mg/dL (ref 0.61–1.24)
Glucose, Bld: 96 mg/dL (ref 70–99)
HCT: 55 % — ABNORMAL HIGH (ref 39.0–52.0)
Hemoglobin: 18.7 g/dL — ABNORMAL HIGH (ref 13.0–17.0)
Potassium: 3.9 mmol/L (ref 3.5–5.1)
Sodium: 139 mmol/L (ref 135–145)
TCO2: 29 mmol/L (ref 22–32)

## 2022-02-18 LAB — PROTIME-INR
INR: 0.9 (ref 0.8–1.2)
Prothrombin Time: 12.2 seconds (ref 11.4–15.2)

## 2022-02-18 MED ORDER — HYDROXYZINE HCL 25 MG PO TABS: ORAL_TABLET | ORAL | 0 refills | Status: AC

## 2022-02-18 MED ORDER — LORAZEPAM 2 MG/ML IJ SOLN
1.0000 mg | Freq: Once | INTRAMUSCULAR | Status: AC
  Administered 2022-02-18: 1 mg via INTRAVENOUS
  Filled 2022-02-18: qty 1

## 2022-02-18 NOTE — ED Provider Notes (Signed)
[place] DEPT Provider Note   CSN: [id number] Arrival date & time: 02/18/22  1800  An emergency department physician performed an initial assessment on this suspected stroke patient at 1830.  History {Add pertinent medical, surgical, social history, OB history to HPI:1} Chief Complaint  Patient presents with   Code Stroke    [facility] is a 66 y.o. male.  Patient states that he feels like his hand could not cut things very well and he was having difficulty walking   Weakness      Home Medications Prior to Admission medications   Medication Sig Start Date End Date Taking? Authorizing Provider  amLODipine (NORVASC) 10 MG tablet Take 10 mg by mouth daily.   Yes [provider]  ascorbic acid (VITAMIN C) 100 MG tablet Take 100 mg by mouth daily.   Yes [provider]  aspirin EC 81 MG tablet Take 81 mg by mouth daily. Swallow whole.   Yes [provider]  buPROPion (WELLBUTRIN SR) 150 MG 12 hr tablet Take 150 mg by mouth 2 (two) times daily.   Yes [provider]  Cholecalciferol (VITAMIN D-1000 MAX ST) 25 MCG (1000 UT) tablet Take 1,000 Units by mouth daily.   Yes [provider]  hydrOXYzine (ATARAX) 25 MG tablet Take 1 every 6-8 hours for any anxiety or stress 02/18/22  Yes Milton Ferguson, MD  ibuprofen (ADVIL,MOTRIN) 200 MG tablet Take 1-2 tablets (200-400 mg total) by mouth every 6 (six) hours as needed. Patient taking differently: Take 200-400 mg by mouth every 6 (six) hours as needed for moderate pain. 10/11/17  Yes Clovis Riley, MD  ketoconazole (NIZORAL) 2 % cream Apply 1 Application topically 2 (two) times daily. 05/22/21  Yes [provider]  ketoconazole (NIZORAL) 2 % shampoo Apply 1 Application topically once a week. 05/22/21  Yes [provider]  methylphenidate (RITALIN LA) 20 MG 24 hr capsule Take one capsule by mouth each morning. Patient taking differently: Take  20 mg by mouth daily. 10/07/19  Yes Burchette, Alinda Sierras, MD  Multiple Vitamin (MULTIVITAMIN) tablet Take 1 tablet by mouth daily.   Yes [provider]  rosuvastatin (CRESTOR) 10 MG tablet Take 10 mg by mouth daily.   Yes [provider]  valsartan-hydrochlorothiazide (DIOVAN-HCT) 320-12.5 MG tablet Take 1 tablet by mouth daily.   Yes [provider]  amLODipine (NORVASC) 5 MG tablet Take 1 tablet (5 mg total) by mouth daily. Patient not taking: Reported on 02/18/2022 09/08/19   Eulas Post, MD  methylphenidate (RITALIN LA) 20 MG 24 hr capsule Take one capsule daily.  May refill in one month. Patient not taking: Reported on 02/18/2022 08/31/19   Eulas Post, MD  methylphenidate (RITALIN LA) 20 MG 24 hr capsule TAKE 1 CAPSULE EACH MORNING. May refill in two months. Patient not taking: Reported on 02/18/2022 08/31/19   Eulas Post, MD  traMADol (ULTRAM) 50 MG tablet Take 1 tablet (50 mg total) by mouth every 6 (six) hours as needed (mild pain). Patient not taking: Reported on 02/18/2022 10/11/17   Clovis Riley, MD  valsartan-hydrochlorothiazide (DIOVAN-HCT) 160-12.5 MG tablet Take 1 tablet by mouth daily. Patient not taking: Reported on 02/18/2022 09/08/19   Eulas Post, MD  varenicline (CHANTIX STARTING MONTH PAK) 0.5 MG X 11 & 1 MG X 42 tablet Take one 0.5 mg tablet by mouth once daily for 3 days, then increase to one 0.5 mg tablet twice daily for 4  days, then increase to one 1 mg tablet twice daily. Patient not taking: Reported on 02/18/2022 11/18/18   Eulas Post, MD  losartan (COZAAR) 50 MG tablet Take 50 mg by mouth every morning.   05/16/11  [provider]      Allergies    Lisdexamfetamine    Review of Systems   Review of Systems  Neurological:  Positive for weakness.    Physical Exam Updated Vital Signs BP (!) 140/85   Pulse 96   Resp 12   Ht '5\' 7"'$  (1.702 m)   Wt 93.9 kg   SpO2 93%   BMI 32.42 kg/m   Physical Exam  ED Results / Procedures / Treatments   Labs (all labs ordered are listed, but only abnormal results are displayed) Labs Reviewed  CBC - Abnormal; Notable for the following components:      Result Value   RBC 5.91 (*)    Hemoglobin 18.0 (*)    HCT 54.0 (*)    All other components within normal limits  COMPREHENSIVE METABOLIC PANEL - Abnormal; Notable for the following components:   Glucose, Bld 101 (*)    Creatinine, Ser 1.25 (*)    ALT 78 (*)    All other components within normal limits  I-STAT CHEM 8, ED - Abnormal; Notable for the following components:   Hemoglobin 18.7 (*)    HCT 55.0 (*)    All other components within normal limits  PROTIME-INR  APTT  DIFFERENTIAL  RAPID URINE DRUG SCREEN, HOSP PERFORMED  URINALYSIS, ROUTINE W REFLEX MICROSCOPIC  ETHANOL  I-STAT CHEM 8, ED    EKG None  Radiology MR BRAIN WO CONTRAST  Result Date: 02/18/2022 CLINICAL DATA:  Left-sided weakness with facial droop EXAM: MRI HEAD WITHOUT CONTRAST MRA HEAD WITHOUT CONTRAST TECHNIQUE: Multiplanar, multi-echo pulse sequences of the brain and surrounding structures were acquired without intravenous contrast. Angiographic images of the Circle of Willis were acquired using MRA technique without intravenous contrast. COMPARISON:  None Available. FINDINGS: MRI HEAD FINDINGS Brain: No acute infarction, hemorrhage, hydrocephalus, extra-axial collection or mass lesion. No chronic microhemorrhage. Normal white matter signal. CSF spaces are normal. 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 The midline structures are normal. Vascular: Normal flow voids Skull and upper cervical spine: Normal bone marrow signal Sinuses/Orbits: Ethmoid mucosal thickening.  Normal orbits. Other: None MRA HEAD FINDINGS POSTERIOR CIRCULATION: --Vertebral arteries: Normal --Inferior cerebellar arteries: Normal. --Basilar artery: Normal. --Superior cerebellar arteries: Normal. --Posterior cerebral  arteries: Normal. ANTERIOR CIRCULATION: --Intracranial internal carotid arteries: Normal. --Anterior cerebral arteries (ACA): Normal. --Middle cerebral arteries (MCA): Normal. IMPRESSION: 1. No acute intracranial abnormality. 2. 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 3. Normal intracranial MRA. Electronically Signed   By: Ulyses Jarred M.D.   On: 02/18/2022 20:43   MR ANGIO HEAD WO CONTRAST  Result Date: 02/18/2022 CLINICAL DATA:  Left-sided weakness with facial droop EXAM: MRI HEAD WITHOUT CONTRAST MRA HEAD WITHOUT CONTRAST TECHNIQUE: Multiplanar, multi-echo pulse sequences of the brain and surrounding structures were acquired without intravenous contrast. Angiographic images of the Circle of Willis were acquired using MRA technique without intravenous contrast. COMPARISON:  None Available. FINDINGS: MRI HEAD FINDINGS Brain: No acute infarction, hemorrhage, hydrocephalus, extra-axial collection or mass lesion. No chronic microhemorrhage. Normal white matter signal. CSF spaces are normal. 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 The midline structures are normal. Vascular: Normal flow voids Skull and upper cervical spine: Normal bone marrow signal Sinuses/Orbits: Ethmoid mucosal thickening.  Normal orbits. Other: None MRA HEAD FINDINGS POSTERIOR CIRCULATION: --Vertebral arteries: Normal --Inferior cerebellar arteries: Normal. --Basilar artery: Normal. --Superior cerebellar arteries: Normal. --Posterior cerebral arteries: Normal. ANTERIOR CIRCULATION: --Intracranial internal carotid arteries: Normal. --Anterior cerebral arteries (ACA): Normal. --Middle cerebral arteries (MCA): Normal. IMPRESSION: 1. No acute intracranial abnormality. 2. 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 3. Normal intracranial MRA. Electronically Signed   By: Ulyses Jarred M.D.   On: 02/18/2022 20:43    Procedures Procedures  {Document cardiac monitor, telemetry assessment  procedure when appropriate:1}  Medications Ordered in ED Medications  LORazepam (ATIVAN) injection 1 mg (1 mg Intravenous Given 02/18/22 1951)    ED Course/ Medical Decision Making/ A&P  CRITICAL CARE Performed by: Milton Ferguson Total critical care time: 40 minutes Critical care time was exclusive of separately billable procedures and treating other patients. Critical care was necessary to treat or prevent imminent or life-threatening deterioration. Critical care was time spent personally by me on the following activities: development of treatment plan with patient and/or surrogate as well as nursing, discussions with consultants, evaluation of patient's response to treatment, examination of patient, obtaining history from patient or surrogate, ordering and performing treatments and interventions, ordering and review of laboratory studies, ordering and review of radiographic studies, pulse oximetry and re-evaluation of patient's condition.                          Medical Decision Making Amount and/or Complexity of Data Reviewed Labs: ordered. Radiology: ordered.  Risk Prescription drug management.   Patient with weakness.  Patient with mild ataxia.  MRI of the head was negative.  Neurology feels like this is more stress and anxiety related.  Patient is given Vistaril I will follow-up with his PCP  {Document critical care time when appropriate:1} {Document review of labs and clinical decision tools ie heart score, Chads2Vasc2 etc:1}  {Document your independent review of radiology images, and any outside records:1} {Document your discussion with family members, caretakers, and with consultants:1} {Document social determinants of health affecting pt's care:1} {Document your decision making why or why not admission, treatments were needed:1} Final Clinical Impression(s) / ED Diagnoses Final diagnoses:  Ataxia    Rx / DC Orders ED Discharge Orders          Ordered    hydrOXYzine  (ATARAX) 25 MG tablet        02/18/22 2217

## 2022-02-18 NOTE — ED Notes (Signed)
Pt arrived via POV, c/o trouble with coordination, states having trouble getting words out. Slight facial droop left sided. Facial sensation in forehead

## 2022-02-18 NOTE — Discharge Instructions (Signed)
Follow-up with your family doctor next week for recheck. 

## 2022-02-18 NOTE — Consult Note (Addendum)
Code stroke activation at 1821.  Dr Cheral Marker paged at [facility] and on camera at [place]. EDP assessed pt at [facility]. Dr Cheral Marker has low suspicion for stroke so recommends transfer to [facility] for MRI. No code stroke CT imaging done.  Off code stroke at [place].  Biagio Borg, Telestroke RN

## 2022-02-18 NOTE — Consult Note (Addendum)
[facility] TeleNeurology Consult Services    Date of Service:  02/18/2022     Metrics: Last Known Well: 1600 Symptoms: As per HPI.  Patient is not a candidate for thrombolytic.   Location of the provider: [facility]  Location of the patient: [facility] Pre-Morbid Modified Rankin Scale: 0 Time Code Stroke Page received:  6:24 PM Time neurologist arrived:  6:28 PM Time NIHSS completed: 6:53 PM    This consult was provided via telemedicine with 2-way video and audio communication. The patient/family was informed that care would be provided in this way and agreed to receive care in this manner.   ED Physician notified of diagnostic impression and management plan at: 6:57 PM   Assessment: 66 year old male presenting with acute onset of dizziness/vertigo and subjective incoordination, diffuse limb weakness and unsteady gait.   - Exam reveals no focal deficits. NIHSS 0. The patient does appear anxious.  - The patient endorses a feeling of "unreality" in the context of his anxiety, feeling as though his current experience is not real. Suggestive of a psychogenic etiology. Hyperventilation could also produce these symptoms, although he is not hyperventilating at the time of exam. Medications do include Ritalin, which could predispose to anxiety or altered sensorium given its stimulant effect.  - He denies any jerking, twitching or other seizure-like activity - Exam performed prior to any imaging. Will hold off on CT head and obtain MRI brain.  - Not a TNK candidate as symptoms are more consistent with anxiety related symptoms versus possible electrolyte disturbance.      Recommendations: - Venous blood gas - Basic metabolic work up - UDS - MRI brain  - MRA head     ------------------------------------------------------------------------------   History of Present Illness: The patient is a 66 year old male with a PMHx of arthritis, depression,  headache, HLD and HTN who presents to the [facility] ED for assessment of new onset neurological symptoms. LKN was got up to do something around the house and felt dizzy, drank some water and still had dizziness/vertigo. Had difficulty cutting a pizza using both hands, which felt unsteady and tingly. Arms were also weak. When walking legs felt like jelly, buckling at the knees. Has never had a similar episode. No facial droop. Face feels numb bilaterally along the jaw. Blurred vision occurred as well in both eyes. No CP, no SOB. Feels heart fluttering. No blood clot history. Denies any headache.      Past Medical History: Past Medical History:  Diagnosis Date   Arthritis    Depression    Headache(784.0)    Hyperlipidemia    Hypertension      Past Surgical History: Past Surgical History:  Procedure Laterality Date   LAPAROSCOPIC APPENDECTOMY N/A 10/10/2017   Procedure: APPENDECTOMY LAPAROSCOPIC;  Surgeon: Clovis Riley, MD;  Location: [facility];  Service: General;  Laterality: N/A;   OTHER SURGICAL HISTORY     bilateral thumb fusions    SHOULDER SURGERY  2013   THUMB FUSION  2005     Medications:  No current facility-administered medications on file prior to encounter.   Current Outpatient Medications on File Prior to Encounter  Medication Sig Dispense Refill   amLODipine (NORVASC) 5 MG tablet Take 1 tablet (5 mg total) by mouth daily. 90 tablet 0   ibuprofen (ADVIL,MOTRIN) 200 MG tablet Take 1-2 tablets (200-400 mg total) by mouth every 6 (six) hours as needed.     methylphenidate (RITALIN LA) 20  MG 24 hr capsule Take one capsule daily.  May refill in one month. 30 capsule 0   methylphenidate (RITALIN LA) 20 MG 24 hr capsule TAKE 1 CAPSULE EACH MORNING. May refill in two months. 30 capsule 0   methylphenidate (RITALIN LA) 20 MG 24 hr capsule Take one capsule by mouth each morning. 60 capsule 0   Multiple Vitamin (MULTIVITAMIN) tablet Take 1 tablet by mouth daily.     traMADol (ULTRAM) 50  MG tablet Take 1 tablet (50 mg total) by mouth every 6 (six) hours as needed (mild pain). 20 tablet 0   valsartan-hydrochlorothiazide (DIOVAN-HCT) 160-12.5 MG tablet Take 1 tablet by mouth daily. 90 tablet 0   varenicline (CHANTIX STARTING MONTH PAK) 0.5 MG X 11 & 1 MG X 42 tablet Take one 0.5 mg tablet by mouth once daily for 3 days, then increase to one 0.5 mg tablet twice daily for 4 days, then increase to one 1 mg tablet twice daily. 53 tablet 0   [DISCONTINUED] losartan (COZAAR) 50 MG tablet Take 50 mg by mouth every morning.           Social History: Drug Use: No EtOH Use: Yes   Family History:  Reviewed in Epic   ROS: As per HPI    Anticoagulant use:  None   Antiplatelet use: ASA   Examination:    BP (!) 153/104   Pulse 96   Resp 12   Ht '5\' 7"'$  (1.702 m)   Wt 93.9 kg   SpO2 97%   BMI 32.42 kg/m     1A: Level of Consciousness - 0 1B: Ask Month and Age - 0 1C: Blink Eyes & Squeeze Hands - 0 2: Test Horizontal Extraocular Movements - 0 3: Test Visual Fields - 0 4: Test Facial Palsy (Use Grimace if Obtunded) - 0 5A: Test Left Arm Motor Drift - 0 5B: Test Right Arm Motor Drift - 0 6A: Test Left Leg Motor Drift - 0 6B: Test Right Leg Motor Drift - 0 7: Test Limb Ataxia (FNF/Heel-Shin) - 0 8: Test Sensation -  0 9: Test Language/Aphasia - 0 10: Test Dysarthria -: 0 11: Test Extinction/Inattention - Extinction to bilateral simultaneous stimulation 0   NIHSS Score: 0     Patient/Family was informed the Neurology Consult would occur via TeleHealth consult by way of interactive audio and video telecommunications and consented to receiving care in this manner.   Patient is being evaluated for possible acute neurologic impairment and high pretest probability of imminent or life-threatening deterioration. I spent total of 40 minutes providing care to this patient, including time for face to face visit via telemedicine, review of medical records, imaging studies and  discussion of findings with providers, the patient and/or family.   Electronically signed: Dr. Kerney Elbe

## 2022-02-18 NOTE — ED Triage Notes (Signed)
Pt arrived via POV. C/o L sided weakness, facial droop, and difficuly w/speech.  Aox4  Code stroke called

## 2023-01-25 ENCOUNTER — Other Ambulatory Visit (HOSPITAL_COMMUNITY): Payer: Self-pay | Admitting: Family Medicine

## 2023-01-25 DIAGNOSIS — Z122 Encounter for screening for malignant neoplasm of respiratory organs: Secondary | ICD-10-CM

## 2023-01-25 DIAGNOSIS — Z87891 Personal history of nicotine dependence: Secondary | ICD-10-CM
